# Patient Record
Sex: Male | Born: 2000
Health system: Southern US, Community
[De-identification: ages and names within clinical notes are randomized; demographics above are authoritative.]

## PROBLEM LIST (undated history)

## (undated) DIAGNOSIS — F32A Depression, unspecified: Secondary | ICD-10-CM

## (undated) DIAGNOSIS — R000 Tachycardia, unspecified: Secondary | ICD-10-CM

## (undated) DIAGNOSIS — F419 Anxiety disorder, unspecified: Secondary | ICD-10-CM

## (undated) DIAGNOSIS — T7840XA Allergy, unspecified, initial encounter: Secondary | ICD-10-CM

## (undated) DIAGNOSIS — F900 Attention-deficit hyperactivity disorder, predominantly inattentive type: Secondary | ICD-10-CM

## (undated) HISTORY — DX: Allergy, unspecified, initial encounter: T78.40XA

## (undated) HISTORY — DX: Tachycardia, unspecified: R00.0

## (undated) HISTORY — DX: Anxiety disorder, unspecified: F41.9

## (undated) HISTORY — PX: TONSILLECTOMY: SUR1361

## (undated) HISTORY — PX: NO PAST SURGERIES: SHX2092

## (undated) HISTORY — DX: Depression, unspecified: F32.A

---
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
## 2018-03-10 ENCOUNTER — Encounter: Payer: Self-pay | Admitting: Gynecology

## 2018-03-10 ENCOUNTER — Other Ambulatory Visit: Payer: Self-pay

## 2018-03-10 ENCOUNTER — Ambulatory Visit
Admission: EM | Admit: 2018-03-10 | Discharge: 2018-03-10 | Disposition: A | Discharge status: Home / Self Care | Payer: BC Managed Care – PPO | Attending: Emergency Medicine | Admitting: Emergency Medicine

## 2018-03-10 DIAGNOSIS — R6889 Other general symptoms and signs: Secondary | ICD-10-CM | POA: Diagnosis not present

## 2018-03-10 LAB — RAPID INFLUENZA A&B ANTIGENS
Influenza A (ARMC): NEGATIVE
Influenza B (ARMC): NEGATIVE

## 2018-03-10 LAB — RAPID STREP SCREEN (MED CTR MEBANE ONLY): Streptococcus, Group A Screen (Direct): NEGATIVE

## 2018-03-10 MED ORDER — BENZONATATE 200 MG PO CAPS: ORAL_CAPSULE | ORAL | 0 refills | Status: DC

## 2018-03-10 MED ORDER — HYDROCOD POLST-CPM POLST ER 10-8 MG/5ML PO SUER: 5.0000 mL | Freq: Two times a day (BID) | ORAL | 0 refills | Status: DC

## 2018-03-10 MED ORDER — OSELTAMIVIR PHOSPHATE 75 MG PO CAPS: 75.0000 mg | ORAL_CAPSULE | Freq: Two times a day (BID) | ORAL | 0 refills | Status: DC

## 2018-03-10 NOTE — ED Provider Notes (Signed)
[facility]    CSN: [id number] Arrival date & time: 03/10/18  1254     History   Chief Complaint No chief complaint on file.   HPI Damon Avila is a 17 y.o. male.   HPI  -year-old male presents with a coughnasal congestion and discharge, mildly bloody sputum and a sore throat.  He works at [employer] and received a flu shot in September.  He has been taking care of a sick sibling at home who was diagnosed with flu type a.  His symptoms started suddenly yesterday.        Past Medical History:  Diagnosis Date  . ADHD (attention deficit hyperactivity disorder), inattentive type   . Anxiety    a. Anxiety episode freshman year in high school.  . Sinus tachycardia    a. 04/2017 Echo [facility]): nl LV/RV fxn, structurally nl heart; b. 05/2017 Event monitor (2 wks): Avg rate 82 bpm, Min HR 47, max HR 162. Predominantly sinus rhythm. Isolated SVE's/couplets (rare - <1%).    Patient Active Problem List   Diagnosis Date Noted  . Tachycardia 05/15/2017  . Attention deficit hyperactivity disorder (ADHD), combined type 06/26/2013  . GAD (generalized anxiety disorder) 06/26/2013  . MDD (major depressive disorder), single episode, severe (HCC) 06/25/2013    Past Surgical History:  Procedure Laterality Date  . NO PAST SURGERIES         Home Medications    Prior to Admission medications   Medication Sig Start Date End Date Taking? Authorizing Provider  metoprolol succinate (TOPROL-XL) 25 MG 24 hr tablet Take 1 tablet (25 mg total) by mouth daily. 08/04/17  Yes Creig HinesBerge, Christopher Ronald, NP  propranolol (INDERAL) 10 MG tablet Take 1 tablet (10 mg total) by mouth 3 (three) times daily as needed. 05/17/17  Yes Antonieta IbaGollan, Timothy J, MD  benzonatate (TESSALON) 200 MG capsule Take one cap TID PRN cough 03/10/18   Lutricia Feiloemer, Aneeka Bowden P, PA-C  chlorpheniramine-HYDROcodone [facility](TUSSIONEX PENNKINETIC ER) 10-8 MG/5ML SUER Take 5 mLs by mouth 2 (two) times daily. 03/10/18   Lutricia Feiloemer, Alize Borrayo P, PA-C    ondansetron (ZOFRAN ODT) 4 MG disintegrating tablet Take 1 tablet (4 mg total) by mouth every 8 (eight) hours as needed for nausea or vomiting. 08/30/17   Renne CriglerGeiple, Joshua, PA-C  oseltamivir (TAMIFLU) 75 MG capsule Take 1 capsule (75 mg total) by mouth every 12 (twelve) hours. 03/10/18   Lutricia Feiloemer, Maddisen Vought P, PA-C    Family History Family History  Problem Relation Age of Onset  . Depression Mother   . Depression Father     Social History Social History   Tobacco Use  . Smoking status: Never Smoker  . Smokeless tobacco: Never Used  Substance Use Topics  . Alcohol use: No    Alcohol/week: 0.0 standard drinks  . Drug use: No     Allergies   Patient has no known allergies.   Review of Systems Review of Systems  Constitutional: Positive for activity change, chills, fatigue and fever.  HENT: Positive for congestion, postnasal drip, rhinorrhea, sinus pressure, sinus pain and sore throat.   Respiratory: Positive for cough.   All other systems reviewed and are negative.    Physical Exam Triage Vital Signs ED Triage Vitals  Enc Vitals Group     BP 03/10/18 1321 107/69     Pulse Rate 03/10/18 1321 (!) 108     Resp 03/10/18 1321 16     Temp 03/10/18 1321 99 F (37.2 C)     Temp  Source 03/10/18 1318 Oral     SpO2 03/10/18 1321 99 %     Weight 03/10/18 1323 135 lb (61.2 kg)     Height 03/10/18 1323 5\' 8"  (1.727 m)     Head Circumference --      Peak Flow --      Pain Score 03/10/18 1323 3     Pain Loc --      Pain Edu? --      Excl. in GC? --    No data found.  Updated Vital Signs BP 107/69 (BP Location: Left Arm)   Pulse (!) 108   Temp 99 F (37.2 C)   Resp 16   Ht 5\' 8"  (1.727 m)   Wt 135 lb (61.2 kg)   SpO2 99%   BMI 20.53 kg/m   Visual Acuity Right Eye Distance:   Left Eye Distance:   Bilateral Distance:    Right Eye Near:   Left Eye Near:    Bilateral Near:     Physical Exam Vitals signs and nursing note reviewed.  Constitutional:      General: He  is not in acute distress.    Appearance: Normal appearance. He is normal weight. He is not ill-appearing, toxic-appearing or diaphoretic.  HENT:     Head: Normocephalic and atraumatic.     Right Ear: Tympanic membrane and ear canal normal.     Left Ear: Tympanic membrane and ear canal normal.     Nose: Nose normal.     Mouth/Throat:     Mouth: Mucous membranes are moist.     Pharynx: No oropharyngeal exudate or posterior oropharyngeal erythema.  Eyes:     General:        Right eye: No discharge.        Left eye: No discharge.     Conjunctiva/sclera: Conjunctivae normal.     Pupils: Pupils are equal, round, and reactive to light.  Neck:     Musculoskeletal: Normal range of motion and neck supple.  Pulmonary:     Effort: Pulmonary effort is normal.     Breath sounds: Normal breath sounds.  Musculoskeletal: Normal range of motion.  Lymphadenopathy:     Cervical: No cervical adenopathy.  Skin:    General: Skin is warm and dry.  Neurological:     General: No focal deficit present.     Mental Status: He is alert and oriented to person, place, and time.  Psychiatric:        Mood and Affect: Mood normal.        Behavior: Behavior normal.        Thought Content: Thought content normal.        Judgment: Judgment normal.      UC Treatments / Results  Labs (all labs ordered are listed, but only abnormal results are displayed) Labs Reviewed  RAPID STREP SCREEN ([facility] ONLY)  RAPID INFLUENZA A&B ANTIGENS ([facility] ONLY)  CULTURE, GROUP A STREP [facility])    EKG None  Radiology No results found.  Procedures Procedures (including critical care time)  Medications Ordered in UC Medications - No data to display  Initial Impression / Assessment and Plan / UC Course  I have reviewed the triage vital signs and the nursing notes.  Pertinent labs & imaging results that were available during my care of the patient were reviewed by me and considered in my medical decision making  (see chart for details).   Patient has symptoms of flulike illness.  He already had a flu shot but despite that after caring for a very close sibling with type A positive flu he now has the same symptoms.  Offered him Tamiflu which he excepted.  I will also treat his cough with cough suppressants.  Remain out of work until his fever is broken.  He must rest and drink plenty of fluids.  Use Tylenol or Motrin for body aches or fever.  He worsens he should return to our clinic or follow-up at a primary care physician   Final Clinical Impressions(s) / UC Diagnoses   Final diagnoses:  Flu-like symptoms   Discharge Instructions   None    ED Prescriptions    Medication Sig Dispense Auth. Provider   oseltamivir (TAMIFLU) 75 MG capsule Take 1 capsule (75 mg total) by mouth every 12 (twelve) hours. 10 capsule Lutricia Feil, PA-C   chlorpheniramine-HYDROcodone (TUSSIONEX PENNKINETIC ER) 10-8 MG/5ML SUER Take 5 mLs by mouth 2 (two) times daily. 60 mL Ovid Curd P, PA-C   benzonatate (TESSALON) 200 MG capsule Take one cap TID PRN cough 30 capsule Lutricia Feil, PA-C     Controlled Substance Prescriptions [place] Controlled Substance Registry consulted? Not Applicable   Lutricia Feil, PA-C 03/10/18 1423

## 2018-03-10 NOTE — ED Triage Notes (Signed)
Patient c/o cough / nasal drip/ bloody sputum / sore throat. Per patient member in household diagnose with flu.

## 2018-03-13 ENCOUNTER — Telehealth (HOSPITAL_COMMUNITY): Payer: Self-pay | Admitting: Emergency Medicine

## 2018-03-13 LAB — CULTURE, GROUP A STREP (THRC)

## 2018-03-13 NOTE — Telephone Encounter (Signed)
Culture is positive for non group A Strep germ.  This is a finding of uncertain significance; not the typical 'strep throat' germ.  Pt states hes not worried about his throat and went to his PCP and they placed him on antibiotics for pneumonia. All questions answered.

## 2018-12-11 DIAGNOSIS — J0301 Acute recurrent streptococcal tonsillitis: Secondary | ICD-10-CM | POA: Insufficient documentation

## 2018-12-11 HISTORY — DX: Acute recurrent streptococcal tonsillitis: J03.01

## 2018-12-16 ENCOUNTER — Encounter: Payer: Self-pay | Admitting: Emergency Medicine

## 2018-12-16 ENCOUNTER — Other Ambulatory Visit: Payer: Self-pay

## 2018-12-16 ENCOUNTER — Ambulatory Visit
Admission: EM | Admit: 2018-12-16 | Discharge: 2018-12-16 | Disposition: A | Discharge status: Home / Self Care | Payer: BC Managed Care – PPO | Attending: Family Medicine | Admitting: Family Medicine

## 2018-12-16 DIAGNOSIS — J039 Acute tonsillitis, unspecified: Secondary | ICD-10-CM

## 2018-12-16 DIAGNOSIS — J029 Acute pharyngitis, unspecified: Secondary | ICD-10-CM

## 2018-12-16 LAB — RAPID STREP SCREEN (MED CTR MEBANE ONLY): Streptococcus, Group A Screen (Direct): NEGATIVE

## 2018-12-16 MED ORDER — CEFDINIR 300 MG PO CAPS: 300.0000 mg | ORAL_CAPSULE | Freq: Two times a day (BID) | ORAL | 0 refills | Status: AC

## 2018-12-16 NOTE — ED Triage Notes (Signed)
Patient c/o sore throat for the past 2-3 days.  Patient denies fevers.  

## 2018-12-16 NOTE — ED Provider Notes (Signed)
[facility]    CSN: [id number] Arrival date & time: 12/16/18  [id number]  History   Chief Complaint Chief Complaint  Patient presents with  . Sore Throat   HPI  18 year old male presents with sore throat.  2 to 3-day history of sore throat.  Pain is currently mild, 5/10 in severity.  Patient states that he has a history of recurrent strep pharyngitis/tonsillitis.  Patient states that he had strep approximately 1 month ago.  Patient states that he has an upcoming surgery scheduled at the end of the year for tonsillectomy.  Patient denies fever.  Denies cough or shortness of breath.  He has no other associated symptoms.  No medications or interventions tried.  No known exacerbating or relieving factors.  No other complaints.  PMH, Surgical Hx, Family Hx, Social History reviewed and updated as below.  Past Medical History:  Diagnosis Date  . ADHD (attention deficit hyperactivity disorder), inattentive type   . Anxiety    a. Anxiety episode freshman year in high school.  . Sinus tachycardia    a. 04/2017 Echo [facility]): nl LV/RV fxn, structurally nl heart; b. 05/2017 Event monitor (2 wks): Avg rate 82 bpm, Min HR 47, max HR 162. Predominantly sinus rhythm. Isolated SVE's/couplets (rare - <1%).  Recurrent strep  Patient Active Problem List   Diagnosis Date Noted  . Tachycardia 05/15/2017  . Attention deficit hyperactivity disorder (ADHD), combined type 06/26/2013  . GAD (generalized anxiety disorder) 06/26/2013  . MDD (major depressive disorder), single episode, severe (HCC) 06/25/2013    Past Surgical History:  Procedure Laterality Date  . NO PAST SURGERIES      Home Medications    Prior to Admission medications   Medication Sig Start Date End Date Taking? Authorizing Provider  cetirizine (ZYRTEC) 10 MG tablet Take by mouth.   Yes [provider]  propranolol (INDERAL) 10 MG tablet Take 1 tablet (10 mg total) by mouth 3 (three) times daily as needed. 05/17/17  Yes  Antonieta IbaGollan, Timothy J, MD  sertraline (ZOLOFT) 50 MG tablet  08/27/18  Yes [provider]  traZODone (DESYREL) 50 MG tablet Take by mouth.   Yes [provider]  cefdinir (OMNICEF) 300 MG capsule Take 1 capsule (300 mg total) by mouth 2 (two) times daily for 10 days. 12/16/18 12/26/18  Tommie Samsook, Christella App G, DO  metoprolol succinate (TOPROL-XL) 25 MG 24 hr tablet Take 1 tablet (25 mg total) by mouth daily. 08/04/17 12/16/18  Creig HinesBerge, Christopher Ronald, NP    Family History Family History  Problem Relation Age of Onset  . Depression Mother   . Depression Father     Social History Social History   Tobacco Use  . Smoking status: Never Smoker  . Smokeless tobacco: Never Used  Substance Use Topics  . Alcohol use: No    Alcohol/week: 0.0 standard drinks  . Drug use: No     Allergies   Patient has no known allergies.   Review of Systems Review of Systems  Constitutional: Negative for fever.  HENT: Positive for sore throat.   Respiratory: Negative.    Physical Exam Triage Vital Signs ED Triage Vitals  Enc Vitals Group     BP 12/16/18 0832 115/69     Pulse Rate 12/16/18 0832 81     Resp 12/16/18 0832 16     Temp 12/16/18 0832 98.4 F (36.9 C)     Temp Source 12/16/18 0832 Oral     SpO2 12/16/18 0832 100 %  Weight 12/16/18 0827 132 lb (59.9 kg)     Height 12/16/18 0827 5\' 7"  (1.702 m)     Head Circumference --      Peak Flow --      Pain Score 12/16/18 0827 5     Pain Loc --      Pain Edu? --      Excl. in [place]? --    Updated Vital Signs BP 115/69 (BP Location: Left Arm)   Pulse 81   Temp 98.4 F (36.9 C) (Oral)   Resp 16   Ht 5\' 7"  (1.702 m)   Wt 59.9 kg   SpO2 100%   BMI 20.67 kg/m   Visual Acuity Right Eye Distance:   Left Eye Distance:   Bilateral Distance:    Right Eye Near:   Left Eye Near:    Bilateral Near:     Physical Exam Vitals signs and nursing note reviewed.  Constitutional:      Appearance: He is well-developed. He is not  ill-appearing.  HENT:     Head: Normocephalic and atraumatic.     Right Ear: Tympanic membrane and ear canal normal.     Left Ear: Tympanic membrane and ear canal normal.     Nose: No rhinorrhea.     Mouth/Throat:     Mouth: Mucous membranes are moist.     Pharynx: Uvula midline. Posterior oropharyngeal erythema present. No oropharyngeal exudate or uvula swelling.     Tonsils: No tonsillar exudate. 2+ on the right. 2+ on the left.  Eyes:     General:        Right eye: No discharge.        Left eye: No discharge.     Conjunctiva/sclera: Conjunctivae normal.  Cardiovascular:     Rate and Rhythm: Normal rate and regular rhythm.     Heart sounds: No murmur.  Pulmonary:     Effort: Pulmonary effort is normal.     Breath sounds: Normal breath sounds. No wheezing, rhonchi or rales.  Skin:    General: Skin is warm.     Findings: No rash.  Neurological:     Mental Status: He is alert and oriented to person, place, and time.  Psychiatric:        Mood and Affect: Mood normal.        Behavior: Behavior normal.    UC Treatments / Results  Labs (all labs ordered are listed, but only abnormal results are displayed) Labs Reviewed  RAPID STREP SCREEN ([facility] ONLY)  CULTURE, GROUP A STREP [facility])    EKG   Radiology No results found.  Procedures Procedures (including critical care time)  Medications Ordered in UC Medications - No data to display  Initial Impression / Assessment and Plan / UC Course  I have reviewed the triage vital signs and the nursing notes.  Pertinent labs & imaging results that were available during my care of the patient were reviewed by me and considered in my medical decision making (see chart for details).    18 year old male presents with tonsillitis.  Rapid strep negative.  Suspect that patient is going to test positive on culture.  Placing on empiric Omnicef while awaiting culture.  Final Clinical Impressions(s) / UC Diagnoses   Final  diagnoses:  Acute tonsillitis, unspecified etiology     Discharge Instructions     Strep negative.  Antibiotic while awaiting culture.  Take care  Dr. Lacinda Axon    ED Prescriptions  Medication Sig Dispense Auth. Provider   cefdinir (OMNICEF) 300 MG capsule Take 1 capsule (300 mg total) by mouth 2 (two) times daily for 10 days. 20 capsule Tommie Sams, DO     PDMP not reviewed this encounter.   Tommie Sams, [place] 12/16/18 [phone]

## 2018-12-16 NOTE — Discharge Instructions (Signed)
Strep negative.  Antibiotic while awaiting culture.  Take care  Dr. Lacinda Axon

## 2018-12-17 ENCOUNTER — Telehealth (HOSPITAL_COMMUNITY): Payer: Self-pay | Admitting: Emergency Medicine

## 2018-12-17 LAB — CULTURE, GROUP A STREP (THRC)

## 2018-12-17 NOTE — Telephone Encounter (Signed)
Strep culture positive. Pt on cefdinir. Contacted pt to let him know. All questions answered.

## 2018-12-23 ENCOUNTER — Ambulatory Visit
Admission: EM | Admit: 2018-12-23 | Discharge: 2018-12-23 | Disposition: A | Discharge status: Home / Self Care | Payer: BC Managed Care – PPO | Attending: Family Medicine | Admitting: Family Medicine

## 2018-12-23 ENCOUNTER — Other Ambulatory Visit: Payer: Self-pay

## 2018-12-23 ENCOUNTER — Encounter: Payer: Self-pay | Admitting: Emergency Medicine

## 2018-12-23 DIAGNOSIS — R197 Diarrhea, unspecified: Secondary | ICD-10-CM | POA: Diagnosis not present

## 2018-12-23 LAB — C DIFFICILE QUICK SCREEN W PCR REFLEX
C Diff antigen: NEGATIVE
C Diff interpretation: NOT DETECTED
C Diff toxin: NEGATIVE

## 2018-12-23 NOTE — Discharge Instructions (Signed)
Continue medication as prescribed. Rest. Drink plenty of fluids. Over the counter probiotics. Monitor.   Follow up with your primary care physician this week as needed. Return to Urgent care for new or worsening concerns.

## 2018-12-23 NOTE — ED Triage Notes (Signed)
Patient c/o diarrhea that started this morning.  Patient states that he has been taking Omnicef for strep throat.  Patient denies fevers.  Patient also reports stomach cramps.

## 2018-12-23 NOTE — ED Provider Notes (Signed)
[facility] ____________________________________________  Time seen: Approximately 11:26 AM  I have reviewed the triage vital signs and the nursing notes.   HISTORY  Chief Complaint Diarrhea   HPI Damon Avila is a 18 y.o. male presenting for evaluation of diarrhea that started this morning.  Reports he has had multiple at least 5 episodes of diarrhea since starting.  States some intermittent lower abdominal cramping but not specific pain.  Denies accompanying fevers, vomiting, back pain, dysuria or other complaints.  Patient was seen 7 days ago and treated for strep throat with oral Omnicef.  Patient expressed concern of C. difficile because of diarrhea onset while taking antibiotic.  Denies known food triggers.  Diarrhea is described as loose, brown, somewhat mucousy.  Nonbloody diarrhea.  No fevers.  Reports sore throat much improved.  Reports otherwise doing well.  Denies aggravating or alleviating factors.  Reports this is approximately his third course of antibiotics this year.    Past Medical History:  Diagnosis Date  . ADHD (attention deficit hyperactivity disorder), inattentive type   . Anxiety    a. Anxiety episode freshman year in high school.  . Sinus tachycardia    a. 04/2017 Echo [facility]): nl LV/RV fxn, structurally nl heart; b. 05/2017 Event monitor (2 wks): Avg rate 82 bpm, Min HR 47, max HR 162. Predominantly sinus rhythm. Isolated SVE's/couplets (rare - <1%).    Patient Active Problem List   Diagnosis Date Noted  . Tachycardia 05/15/2017  . Attention deficit hyperactivity disorder (ADHD), combined type 06/26/2013  . GAD (generalized anxiety disorder) 06/26/2013  . MDD (major depressive disorder), single episode, severe ([place]) 06/25/2013    Past Surgical History:  Procedure Laterality Date  . NO PAST SURGERIES       No current facility-administered medications for this encounter.   Current Outpatient Medications:  .  cefdinir (OMNICEF) 300 MG  capsule, Take 1 capsule (300 mg total) by mouth 2 (two) times daily for 10 days., Disp: 20 capsule, Rfl: 0 .  cetirizine (ZYRTEC) 10 MG tablet, Take by mouth., Disp: , Rfl:  .  propranolol (INDERAL) 10 MG tablet, Take 1 tablet (10 mg total) by mouth 3 (three) times daily as needed., Disp: 90 tablet, Rfl: 3 .  sertraline (ZOLOFT) 50 MG tablet, , Disp: , Rfl:  .  traZODone (DESYREL) 50 MG tablet, Take by mouth., Disp: , Rfl:   Allergies Patient has no known allergies.  Family History  Problem Relation Age of Onset  . Depression Mother   . Depression Father     Social History Social History   Tobacco Use  . Smoking status: Never Smoker  . Smokeless tobacco: Never Used  Substance Use Topics  . Alcohol use: No    Alcohol/week: 0.0 standard drinks  . Drug use: No    Review of Systems Constitutional: No fever ENT: Recent sore throat. Cardiovascular: Denies chest pain. Respiratory: Denies shortness of breath. Gastrointestinal: No abdominal pain.  No nausea, no vomiting.  Positive diarrhea.  No constipation. Genitourinary: Negative for dysuria. Musculoskeletal: Negative for back pain. Skin: Negative for rash.   ____________________________________________   PHYSICAL EXAM:  VITAL SIGNS: ED Triage Vitals  Enc Vitals Group     BP 12/23/18 1100 129/85     Pulse Rate 12/23/18 1100 88     Resp 12/23/18 1100 16     Temp 12/23/18 1100 98.4 F (36.9 C)     Temp Source 12/23/18 1100 Oral     SpO2 12/23/18 1100 99 %  Weight 12/23/18 1057 132 lb (59.9 kg)     Height 12/23/18 1057 5\' 8"  (1.727 m)     Head Circumference --      Peak Flow --      Pain Score 12/23/18 1056 4     Pain Loc --      Pain Edu? --      Excl. in GC? --     Constitutional: Alert and oriented. Well appearing and in no acute distress. Eyes: Conjunctivae are normal.  ENT      Head: Normocephalic and atraumatic. Cardiovascular: Normal rate, regular rhythm. Grossly normal heart sounds.  Good peripheral  circulation. Respiratory: Normal respiratory effort without tachypnea nor retractions. Breath sounds are clear and equal bilaterally. No wheezes, rales, rhonchi. Gastrointestinal: Normal Bowel sounds.  Abdomen soft and minimal diffuse lower abdominal tenderness palpation.  Abdomen otherwise soft nontender.  No CVA tenderness. Musculoskeletal: Steady gait. Neurologic:  Normal speech and language.  Speech is normal. No gait instability.  Skin:  Skin is warm, dry and intact. No rash noted. Psychiatric: Mood and affect are normal. Speech and behavior are normal. Patient exhibits appropriate insight and judgment   ___________________________________________   LABS (all labs ordered are listed, but only abnormal results are displayed)  Labs Reviewed  C DIFFICILE QUICK SCREEN W PCR REFLEX  GI PATHOGEN PANEL BY PCR, STOOL     PROCEDURES Procedures    INITIAL IMPRESSION / ASSESSMENT AND PLAN / ED COURSE  Pertinent labs & imaging results that were available during my care of the patient were reviewed by me and considered in my medical decision making (see chart for details).  Well-appearing patient.  No acute distress.  Currently on cefdinir for strep throat.  Patient has 3 days left, directed to complete antibiotic as prescribed.  Patient started with diarrhea today, nonbloody and no fevers.  Suspect most likely side effect from antibiotic use and recommended over-the-counter probiotics, Imodium starting tomorrow as needed and brat diet.  However will conservatively test GI panel as well as C. difficile.  Supportive care.  Work note given.  Discussed follow up with Primary care physician this week. Discussed follow up and return parameters including no resolution or any worsening concerns. Patient verbalized understanding and agreed to plan.   ____________________________________________   FINAL CLINICAL IMPRESSION(S) / ED DIAGNOSES  Final diagnoses:  Diarrhea, unspecified type      ED Discharge Orders    None       Note: This dictation was prepared with Dragon dictation along with smaller phrase technology. Any transcriptional errors that result from this process are unintentional.         12/25/18, NP 12/23/18 1158

## 2018-12-24 ENCOUNTER — Telehealth (HOSPITAL_COMMUNITY): Payer: Self-pay | Admitting: Emergency Medicine

## 2018-12-24 NOTE — Telephone Encounter (Signed)
C diff negative. Attempted to reach patient. No answer at this time.

## 2018-12-28 ENCOUNTER — Telehealth (HOSPITAL_COMMUNITY): Payer: Self-pay | Admitting: Emergency Medicine

## 2018-12-28 LAB — GI PATHOGEN PANEL BY PCR, STOOL

## 2018-12-28 NOTE — Telephone Encounter (Signed)
No pathogens detected on sample. Attempted to reach patient. No answer at this time. Notified in [place]

## 2019-03-27 ENCOUNTER — Ambulatory Visit: Payer: BC Managed Care – PPO | Attending: Internal Medicine

## 2019-03-27 DIAGNOSIS — Z20822 Contact with and (suspected) exposure to covid-19: Secondary | ICD-10-CM

## 2019-03-28 LAB — NOVEL CORONAVIRUS, NAA: SARS-CoV-2, NAA: NOT DETECTED

## 2019-05-18 ENCOUNTER — Ambulatory Visit: Payer: BC Managed Care – PPO | Attending: Internal Medicine

## 2019-05-18 DIAGNOSIS — Z23 Encounter for immunization: Secondary | ICD-10-CM | POA: Insufficient documentation

## 2019-05-18 NOTE — Progress Notes (Signed)
   Covid-19 Vaccination Clinic  Name:  Damon Avila    MRN: [id number] DOB: 07/25/2000  05/18/2019  Damon Avila was observed post Covid-19 immunization for 15 minutes without incidence. He was provided with Vaccine Information Sheet and instruction to access the V-Safe system.   Damon Avila was instructed to call 911 with any severe reactions post vaccine: Marland Kitchen Difficulty breathing  . Swelling of your face and throat  . A fast heartbeat  . A bad rash all over your body  . Dizziness and weakness    Immunizations Administered    Name Date Dose VIS Date Route   Pfizer COVID-19 Vaccine 05/18/2019  8:35 AM 0.3 mL 03/08/2019 Intramuscular   Manufacturer: [employer], [employer]   Lot: [id number]   NDC: [address]-0

## 2019-06-11 ENCOUNTER — Ambulatory Visit: Payer: BC Managed Care – PPO | Attending: Internal Medicine

## 2019-06-11 DIAGNOSIS — Z23 Encounter for immunization: Secondary | ICD-10-CM

## 2019-06-11 NOTE — Progress Notes (Signed)
   Covid-19 Vaccination Clinic  Name:  Damon Avila    MRN: [id number] DOB: 03-Mar-2001  06/11/2019  Damon Avila was observed post Covid-19 immunization for 15 minutes without incident. He was provided with Vaccine Information Sheet and instruction to access the V-Safe system.   Damon Avila was instructed to call 911 with any severe reactions post vaccine: Marland Kitchen Difficulty breathing  . Swelling of face and throat  . A fast heartbeat  . A bad rash all over body  . Dizziness and weakness   Immunizations Administered    Name Date Dose VIS Date Route   Pfizer COVID-19 Vaccine 06/11/2019  1:10 PM 0.3 mL 03/08/2019 Intramuscular   Manufacturer: [employer], [employer]   Lot: [id number]   NDC: [address]-1
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
## 2019-11-27 ENCOUNTER — Ambulatory Visit
Admission: EM | Admit: 2019-11-27 | Discharge: 2019-11-27 | Disposition: A | Discharge status: Home / Self Care | Payer: 59 | Attending: Family Medicine | Admitting: Family Medicine

## 2019-11-27 DIAGNOSIS — Z202 Contact with and (suspected) exposure to infections with a predominantly sexual mode of transmission: Secondary | ICD-10-CM | POA: Diagnosis not present

## 2019-11-27 DIAGNOSIS — Z113 Encounter for screening for infections with a predominantly sexual mode of transmission: Secondary | ICD-10-CM

## 2019-11-27 MED ORDER — METRONIDAZOLE 500 MG PO TABS: 500.0000 mg | ORAL_TABLET | Freq: Two times a day (BID) | ORAL | 0 refills | Status: DC

## 2019-11-27 MED ORDER — CEFTRIAXONE SODIUM 500 MG IJ SOLR
500.0000 mg | Freq: Once | INTRAMUSCULAR | Status: AC
  Administered 2019-11-27: 500 mg via INTRAMUSCULAR

## 2019-11-27 MED ORDER — AZITHROMYCIN 500 MG PO TABS
1000.0000 mg | ORAL_TABLET | Freq: Once | ORAL | Status: AC
  Administered 2019-11-27: 1000 mg via ORAL

## 2019-11-27 NOTE — ED Provider Notes (Signed)
[facility]   [id number] 11/27/19 Arrival Time: 1422   CC: CONCERN FOR STD  SUBJECTIVE:  Damon Avila is a 19 y.o. male who presents requesting STI screening. Currently asymptomatic. Reports that partner rested positive for chlamydia. Last unprotected sexual encounter last week.  Denies similar symptoms in the past. Requesting UA in office today as well. Denies urinary symptoms.  Denies fever, chills, nausea, vomiting, abdominal or pelvic pain, penile rashes or lesions, testicular swelling or pain.     ROS: As per HPI.  All other pertinent ROS negative.     Past Medical History:  Diagnosis Date  . ADHD (attention deficit hyperactivity disorder), inattentive type   . Anxiety    a. Anxiety episode freshman year in high school.  . Sinus tachycardia    a. 04/2017 Echo [facility]): nl LV/RV fxn, structurally nl heart; b. 05/2017 Event monitor (2 wks): Avg rate 82 bpm, Min HR 47, max HR 162. Predominantly sinus rhythm. Isolated SVE's/couplets (rare - <1%).   Past Surgical History:  Procedure Laterality Date  . NO PAST SURGERIES     No Known Allergies No current facility-administered medications on file prior to encounter.   Current Outpatient Medications on File Prior to Encounter  Medication Sig Dispense Refill  . cetirizine (ZYRTEC) 10 MG tablet Take by mouth.    . propranolol (INDERAL) 10 MG tablet Take 1 tablet (10 mg total) by mouth 3 (three) times daily as needed. 90 tablet 3  . sertraline (ZOLOFT) 50 MG tablet     . traZODone (DESYREL) 50 MG tablet Take by mouth.    . [DISCONTINUED] metoprolol succinate (TOPROL-XL) 25 MG 24 hr tablet Take 1 tablet (25 mg total) by mouth daily. 30 tablet 11   Social History   Socioeconomic History  . Marital status: Single    Spouse name: Not on file  . Number of children: Not on file  . Years of education: Not on file  . Highest education level: Not on file  Occupational History  . Not on file  Tobacco Use  . Smoking status:  Never Smoker  . Smokeless tobacco: Never Used  Vaping Use  . Vaping Use: Never used  Substance and Sexual Activity  . Alcohol use: No    Alcohol/week: 0.0 standard drinks  . Drug use: No  . Sexual activity: Not on file  Other Topics Concern  . Not on file  Social History Narrative  . Not on file   Social Determinants of Health   Financial Resource Strain:   . Difficulty of Paying Living Expenses: Not on file  Food Insecurity:   . Worried About Programme researcher, broadcasting/film/video in the Last Year: Not on file  . Ran Out of Food in the Last Year: Not on file  Transportation Needs:   . Lack of Transportation (Medical): Not on file  . Lack of Transportation (Non-Medical): Not on file  Physical Activity:   . Days of Exercise per Week: Not on file  . Minutes of Exercise per Session: Not on file  Stress:   . Feeling of Stress : Not on file  Social Connections:   . Frequency of Communication with Friends and Family: Not on file  . Frequency of Social Gatherings with Friends and Family: Not on file  . Attends Religious Services: Not on file  . Active Member of Clubs or Organizations: Not on file  . Attends Banker Meetings: Not on file  . Marital Status: Not on file  Intimate Partner Violence:   . Fear of Current or Ex-Partner: Not on file  . Emotionally Abused: Not on file  . Physically Abused: Not on file  . Sexually Abused: Not on file   Family History  Problem Relation Age of Onset  . Depression Mother   . Depression Father     OBJECTIVE:  Vitals:   11/27/19 1439  BP: 119/78  Pulse: 80  Resp: 17  Temp: 98.8 F (37.1 C)  TempSrc: Oral  SpO2: 97%     General appearance: alert, NAD, appears stated age Head: NCAT Throat: lips, mucosa, and tongue normal; teeth and gums normal Lungs: CTA bilaterally without adventitious breath sounds Heart: regular rate and rhythm.  Radial pulses 2+ symmetrical bilaterally Back: no CVA tenderness Abdomen: soft, non-tender; bowel  sounds normal; no masses or organomegaly; no guarding or rebound tenderness GU: deferred Skin: warm and dry Psychological:  Alert and cooperative. Normal mood and affect.  LABS:  Results for orders placed or performed in visit on 03/27/19  Novel Coronavirus, NAA ([facility])   Specimen: Nasopharyngeal(NP) swabs in vial transport medium   NASOPHARYNGE  TESTING  Result Value Ref Range   SARS-CoV-2, NAA Not Detected Not Detected    Labs Reviewed  HIV ANTIBODY (ROUTINE TESTING W REFLEX)  RPR  POCT URINALYSIS DIP (MANUAL ENTRY)  CYTOLOGY, (ORAL, ANAL, URETHRAL) ANCILLARY ONLY    ASSESSMENT & PLAN:  1. STD exposure   2. Screen for STD (sexually transmitted disease)     Meds ordered this encounter  Medications  . cefTRIAXone (ROCEPHIN) injection 500 mg  . azithromycin (ZITHROMAX) tablet 1,000 mg  . metroNIDAZOLE (FLAGYL) 500 MG tablet    Sig: Take 1 tablet (500 mg total) by mouth 2 (two) times daily.    Dispense:  14 tablet    Refill:  0    Order Specific Question:   Supervising Provider    Answer:   Merrilee Jansky [[id number]]    Pending: Labs Reviewed  HIV ANTIBODY (ROUTINE TESTING W REFLEX)  RPR  POCT URINALYSIS DIP (MANUAL ENTRY)  CYTOLOGY, (ORAL, ANAL, URETHRAL) ANCILLARY ONLY   UA negative Given rocephin 250mg  injection and azithromycin 1g in office Penile self swab obtained Prescribed metronidazole 500 mg twice daily for 7 days (do not take while consuming alcohol) Take medications as prescribed and to completion We will follow up with you regarding the results of your test If tests are positive, please abstain from sexual activity until you and your partner(s) are treated Follow up with PCP or [facility] if symptoms persists Return here or go to ER if you have any new or worsening symptoms    Reviewed expectations re: course of current medical issues. Questions answered. Outlined signs and symptoms indicating need for more acute intervention. Patient  verbalized understanding. After Visit Summary given.       , NP 11/27/19 1510

## 2019-11-27 NOTE — Discharge Instructions (Addendum)
You were treated with an antibiotic today called Rocephin as well as azithromycin. Take metronidazole twice a day for 7 days. Do not drink alcohol with this medication.  Do not have sex for 7 days.  Your swab tests are pending.  If your test results are positive, we will call you.  You may need additional treatment and your partner(s) may also need treatment.      Your blood tests should be back by tomorrow. We will be in contact with any abnormal results that require further treatment.

## 2019-11-27 NOTE — ED Triage Notes (Signed)
Pt presents for STD's testing. Denies any symptoms. States one of his sexual partners tested positive for chlamydia

## 2019-11-28 LAB — CYTOLOGY, (ORAL, ANAL, URETHRAL) ANCILLARY ONLY
Chlamydia: NEGATIVE
Comment: NEGATIVE
Comment: NEGATIVE
Comment: NORMAL
Neisseria Gonorrhea: NEGATIVE
Trichomonas: NEGATIVE

## 2019-11-28 LAB — HIV ANTIBODY (ROUTINE TESTING W REFLEX): HIV Screen 4th Generation wRfx: NONREACTIVE

## 2019-11-28 LAB — RPR: RPR Ser Ql: NONREACTIVE

## 2020-05-07 ENCOUNTER — Other Ambulatory Visit: Payer: Self-pay

## 2020-05-07 ENCOUNTER — Encounter: Payer: Self-pay | Admitting: Adult Health

## 2020-05-07 ENCOUNTER — Other Ambulatory Visit (HOSPITAL_COMMUNITY)
Admission: RE | Admit: 2020-05-07 | Discharge: 2020-05-07 | Disposition: A | Discharge status: Home / Self Care | Payer: 59 | Source: Ambulatory Visit | Attending: Adult Health | Admitting: Adult Health

## 2020-05-07 ENCOUNTER — Ambulatory Visit (INDEPENDENT_AMBULATORY_CARE_PROVIDER_SITE_OTHER): Payer: 59 | Admitting: Adult Health

## 2020-05-07 VITALS — BP 117/67 | HR 76 | Temp 98.2°F | Resp 16 | Ht 67.0 in | Wt 142.6 lb

## 2020-05-07 DIAGNOSIS — F411 Generalized anxiety disorder: Secondary | ICD-10-CM

## 2020-05-07 DIAGNOSIS — Z0000 Encounter for general adult medical examination without abnormal findings: Secondary | ICD-10-CM

## 2020-05-07 DIAGNOSIS — Z113 Encounter for screening for infections with a predominantly sexual mode of transmission: Secondary | ICD-10-CM

## 2020-05-07 DIAGNOSIS — Z8616 Personal history of COVID-19: Secondary | ICD-10-CM | POA: Diagnosis not present

## 2020-05-07 DIAGNOSIS — R059 Cough, unspecified: Secondary | ICD-10-CM

## 2020-05-07 DIAGNOSIS — Z79899 Other long term (current) drug therapy: Secondary | ICD-10-CM

## 2020-05-07 DIAGNOSIS — R319 Hematuria, unspecified: Secondary | ICD-10-CM | POA: Diagnosis not present

## 2020-05-07 DIAGNOSIS — Z1389 Encounter for screening for other disorder: Secondary | ICD-10-CM | POA: Diagnosis not present

## 2020-05-07 LAB — POCT URINALYSIS DIPSTICK
Bilirubin, UA: NEGATIVE
Glucose, UA: NEGATIVE
Ketones, UA: NEGATIVE
Leukocytes, UA: NEGATIVE
Nitrite, UA: NEGATIVE
Protein, UA: POSITIVE — AB
Spec Grav, UA: >=1.03 — AB (ref 1.010–1.025)
Urobilinogen, UA: 0.2 E.U./dL
pH, UA: 5 (ref 5.0–8.0)

## 2020-05-07 NOTE — Patient Instructions (Signed)
Health Maintenance, Male Adopting a healthy lifestyle and getting preventive care are important in promoting health and wellness. Ask your health care provider about:  The right schedule for you to have regular tests and exams.  Things you can do on your own to prevent diseases and keep yourself healthy. What should I know about diet, weight, and exercise? Eat a healthy diet  Eat a diet that includes plenty of vegetables, fruits, low-fat dairy products, and lean protein.  Do not eat a lot of foods that are high in solid fats, added sugars, or sodium.   Maintain a healthy weight Body mass index (BMI) is a measurement that can be used to identify possible weight problems. It estimates body fat based on height and weight. Your health care provider can help determine your BMI and help you achieve or maintain a healthy weight. Get regular exercise Get regular exercise. This is one of the most important things you can do for your health. Most adults should:  Exercise for at least 150 minutes each week. The exercise should increase your heart rate and make you sweat (moderate-intensity exercise).  Do strengthening exercises at least twice a week. This is in addition to the moderate-intensity exercise.  Spend less time sitting. Even light physical activity can be beneficial. Watch cholesterol and blood lipids Have your blood tested for lipids and cholesterol at 22 years of age, then have this test every 5 years. You may need to have your cholesterol levels checked more often if:  Your lipid or cholesterol levels are high.  You are older than 22 years of age.  You are at high risk for heart disease. What should I know about cancer screening? Many types of cancers can be detected early and may often be prevented. Depending on your health history and family history, you may need to have cancer screening at various ages. This may include screening for:  Colorectal cancer.  Prostate  cancer.  Skin cancer.  Lung cancer. What should I know about heart disease, diabetes, and high blood pressure? Blood pressure and heart disease  High blood pressure causes heart disease and increases the risk of stroke. This is more likely to develop in people who have high blood pressure readings, are of African descent, or are overweight.  Talk with your health care provider about your target blood pressure readings.  Have your blood pressure checked: ? Every 3-5 years if you are 18-39 years of age. ? Every year if you are 40 years old or older.  If you are between the ages of 65 and 75 and are a current or former smoker, ask your health care provider if you should have a one-time screening for abdominal aortic aneurysm (AAA). Diabetes Have regular diabetes screenings. This checks your fasting blood sugar level. Have the screening done:  Once every three years after age 45 if you are at a normal weight and have a low risk for diabetes.  More often and at a younger age if you are overweight or have a high risk for diabetes. What should I know about preventing infection? Hepatitis B If you have a higher risk for hepatitis B, you should be screened for this virus. Talk with your health care provider to find out if you are at risk for hepatitis B infection. Hepatitis C Blood testing is recommended for:  Everyone born from 1945 through 1965.  Anyone with known risk factors for hepatitis C. Sexually transmitted infections (STIs)  You should be screened each   year for STIs, including gonorrhea and chlamydia, if: ? You are sexually active and are younger than 22 years of age. ? You are older than 22 years of age and your health care provider tells you that you are at risk for this type of infection. ? Your sexual activity has changed since you were last screened, and you are at increased risk for chlamydia or gonorrhea. Ask your health care provider if you are at risk.  Ask your  health care provider about whether you are at high risk for HIV. Your health care provider may recommend a prescription medicine to help prevent HIV infection. If you choose to take medicine to prevent HIV, you should first get tested for HIV. You should then be tested every 3 months for as long as you are taking the medicine. Follow these instructions at home: Lifestyle  Do not use any products that contain nicotine or tobacco, such as cigarettes, e-cigarettes, and chewing tobacco. If you need help quitting, ask your health care provider.  Do not use street drugs.  Do not share needles.  Ask your health care provider for help if you need support or information about quitting drugs. Alcohol use  Do not drink alcohol if your health care provider tells you not to drink.  If you drink alcohol: ? Limit how much you have to 0-2 drinks a day. ? Be aware of how much alcohol is in your drink. In the [place] one drink equals one 12 oz bottle of beer (355 mL), one 5 oz glass of wine (148 mL), or one 1 oz glass of hard liquor (44 mL). General instructions  Schedule regular health, dental, and eye exams.  Stay current with your vaccines.  Tell your health care provider if: ? You often feel depressed. ? You have ever been abused or do not feel safe at home. Summary  Adopting a healthy lifestyle and getting preventive care are important in promoting health and wellness.  Follow your health care provider's instructions about healthy diet, exercising, and getting tested or screened for diseases.  Follow your health care provider's instructions on monitoring your cholesterol and blood pressure. This information is not intended to replace advice given to you by your health care provider. Make sure you discuss any questions you have with your health care provider. Document Revised: 03/07/2018 Document Reviewed: 03/07/2018 Elsevier Patient Education  2021 Elsevier Inc. Preventing Sexually  Transmitted Infections, Adult Sexually transmitted infections (STIs) are diseases that are spread from person to person (are contagious). They are spread, or transmitted, through bodily fluids exchanged during sex or sexual contact. These bodily fluids include saliva, semen, blood, vaginal mucus, and urine. STIs are very common among people of all ages. Some common STIs include:  Herpes.  Hepatitis B.  Chlamydia.  Gonorrhea.  Syphilis.  HPV (human papillomavirus).  HIV, also called the human immunodeficiency virus. This is the virus that can cause AIDS (acquired immunodeficiency syndrome). Often, people who have these STIs do not have symptoms. Even without symptoms, these infections can be spread from person to person and require treatment. How can these conditions affect me? STIs can be treated, and many STIs can be cured. However, some STIs cannot be cured and will affect you for the rest of your life. Certain STIs may:  Require you to take medicine for the rest of your life.  Affect your ability to have children (your fertility).  Increase your risk for developing another STI or certain serious health conditions. These may  include: ? Cervical cancer. ? Head and neck cancer. ? Pelvic inflammatory disease (PID), in women. ? Organ damage or damage to other parts of your body, if the infection spreads.  Cause problems during pregnancy and may be transmitted to the baby during the pregnancy or childbirth. What can increase my risk? You may have an increased risk for developing an STI if:  You have unprotected sex. Sex includes oral, vaginal, or anal sex.  You have more than one sex partner.  You have a sex partner who has multiple sex partners.  You have sex with someone who has an STI.  You have an STI or you had an STI before.  You inject drugs or have a sex partner or partners who inject drugs. What actions can I take to prevent STIs? The only way to completely  prevent STIs is not to have sex of any kind. This is called practicing abstinence. If you are sexually active, you can protect yourself and others by taking these actions to lower your risk of getting an STI: Lifestyle Avoid mixing alcohol, drugs, and sex. Alcohol and drug use can affect your ability to make good decisions and can lead to risky sexual behaviors. Medicines Ask your health care provider about taking pre-exposure prophylaxis (PrEP) to prevent HIV infection. General information  Stay up to date on vaccinations. Certain vaccines can lower your risk of getting certain STIs, such as: ? Hepatitis A and hepatitis B vaccines. You may have been vaccinated as a young child, but you will likely need a booster shot as a teen or young adult. ? HPV (human papillomavirus) vaccine.  Have only one sex partner (be monogamous) or limit the number of sex partners you have.  Use methods that prevent the exchange of body fluids between partners (barrier protection) correctly every time you have sex. Barrier protection can be used during oral, vaginal, or anal sex. Commonly used barrier methods include: ? Male condom. ? Male condom. ? Dental dam.  Use a new condom for every sex act from start to finish.  Get tested for STIs. Have your partners get tested, too.  If you test positive for an STI, follow recommendations from your health care provider about treatment and make sure your sex partners are tested and treated.  Birth control pills, injections, implants, and intrauterine devices (IUDs) do not protect against STIs. To prevent both STIs and pregnancy, always use a condom with another form of birth control.  Some STIs, such as herpes, are spread through skin-to-skin contact. A condom may not protect you from getting such STIs. Avoid all sexual contact if you or your partners have herpes and there is an active flare with open sores.   Where to find more information Learn more about STIs  from:  Centers for Disease Control and Prevention: ? More information about specific STIs: BloggingList.ca ? Places to get sexual health counseling and treatment for free or at a low cost: gettested.TonerPromos.no  [place] Department of Health and Human Services: http://hoffman.com/ Summary  Sexually transmitted infections (STIs) can spread through exchanging bodily fluids during sexual contact. Fluids include saliva, semen, blood, vaginal mucus, and urine.  You may have an increased risk for developing an STI if you have unprotected sex. Sex includes oral, vaginal, or anal sex.  If you do have sex, limit your number of sex partners and use barrier protection every time you have sex. This information is not intended to replace advice given to you by your health care  provider. Make sure you discuss any questions you have with your health care provider. Document Revised: 04/29/2019 Document Reviewed: 04/29/2019 Elsevier Patient Education  2021 [employer].

## 2020-05-07 NOTE — Progress Notes (Signed)
New patient visit   Patient: Damon Avila   DOB: Apr 05, 2000   19 y.o. Male  MRN: [id number] Visit Date: 05/07/2020  Today's healthcare provider: Jairo Ben, FNP   Chief Complaint  Patient presents with  . New Patient (Initial Visit)   Subjective    Damon Avila is a 20 y.o. male who presents today as a new patient to establish care.  HPI  Patient presents today as new patient to establish care he states that he feels well today and has no concerns to address. Patient reports that he follows a well balanced diet eating up to 3 meals a day, he is actively exercising and states that sleep patterns are fairly well given that he works 3rd shift.    He desires to be std testing, has had 3 new sexual partners in the last 3 months. He denies any symptoms Denies any penile discharge, rectal or oral symptoms, no urinary symptoms.   He has been on Descovy for HIV prophylaxis in June but not since then. He stopped it.     Patient  denies any fever, body aches,chills, rash, chest pain, shortness of breath, nausea, vomiting, or diarrhea.  Denies dizziness, lightheadedness, pre syncopal or syncopal episodes.    Past Medical History:  Diagnosis Date  . ADHD (attention deficit hyperactivity disorder), inattentive type   . Allergy   . Anxiety    a. Anxiety episode freshman year in high school.  . Depression   . Sinus tachycardia    a. 04/2017 Echo [facility]): nl LV/RV fxn, structurally nl heart; b. 05/2017 Event monitor (2 wks): Avg rate 82 bpm, Min HR 47, max HR 162. Predominantly sinus rhythm. Isolated SVE's/couplets (rare - <1%).   Past Surgical History:  Procedure Laterality Date  . TONSILLECTOMY     Family Status  Relation Name Status  . Mother  Alive  . Father  Alive  . MGM  (Not Specified)  . MGF  (Not Specified)  . PGM  (Not Specified)   Family History  Problem Relation Age of Onset  . Depression Mother   . Diabetes Mother        Type 2  . Depression Father    . COPD Maternal Grandmother   . Bipolar disorder Maternal Grandmother   . Cancer Maternal Grandfather   . Cancer Paternal Grandmother    Social History   Socioeconomic History  . Marital status: Single    Spouse name: Not on file  . Number of children: Not on file  . Years of education: Not on file  . Highest education level: Not on file  Occupational History  . Not on file  Tobacco Use  . Smoking status: Never Smoker  . Smokeless tobacco: Never Used  Vaping Use  . Vaping Use: Never used  Substance and Sexual Activity  . Alcohol use: Yes    Alcohol/week: 2.0 - 3.0 standard drinks    Types: 2 - 3 Glasses of wine per week  . Drug use: No  . Sexual activity: Not on file  Other Topics Concern  . Not on file  Social History Narrative  . Not on file   Social Determinants of Health   Financial Resource Strain: Not on file  Food Insecurity: Not on file  Transportation Needs: Not on file  Physical Activity: Not on file  Stress: Not on file  Social Connections: Not on file   Outpatient Medications Prior to Visit  Medication Sig  . hydrOXYzine (  ATARAX/VISTARIL) 10 MG tablet Take 10 mg by mouth 3 (three) times daily as needed.  . [DISCONTINUED] cetirizine (ZYRTEC) 10 MG tablet Take by mouth.  . [DISCONTINUED] metroNIDAZOLE (FLAGYL) 500 MG tablet Take 1 tablet (500 mg total) by mouth 2 (two) times daily.  . [DISCONTINUED] propranolol (INDERAL) 10 MG tablet Take 1 tablet (10 mg total) by mouth 3 (three) times daily as needed.  . [DISCONTINUED] sertraline (ZOLOFT) 50 MG tablet   . [DISCONTINUED] traZODone (DESYREL) 50 MG tablet Take by mouth.   No facility-administered medications prior to visit.   No Known Allergies  Immunization History  Administered Date(s) Administered  . PFIZER(Purple Top)SARS-COV-2 Vaccination 05/18/2019, 06/11/2019    Health Maintenance  Topic Date Due  . Hepatitis C Screening  Never done  . TETANUS/TDAP  Never done  . COVID-19 Vaccine (3 -  Booster for Pfizer series) 12/12/2019  . INFLUENZA VACCINE  Completed  . HIV Screening  Completed    Patient Care Team: Patient, No Pcp Per as PCP - General (General Practice)  Review of Systems  Constitutional: Negative.   HENT: Negative.   Respiratory: Positive for cough (intermittent ) and shortness of breath (occasionial since covid january 9th ). Negative for apnea, choking, chest tightness, wheezing and stridor.   Genitourinary: Negative.   All other systems reviewed and are negative.     Objective    BP 117/67   Pulse 76   Temp 98.2 F (36.8 C) (Oral)   Resp 16   Ht 5\' 7"  (1.702 m)   Wt 142 lb 9.6 oz (64.7 kg)   SpO2 100%   BMI 22.33 kg/m  Physical Exam Vitals and nursing note reviewed.  Constitutional:      General: He is active. He is not in acute distress.    Appearance: Normal appearance. He is well-developed and well-nourished. He is not ill-appearing, toxic-appearing, sickly-appearing or diaphoretic.     Comments: Patient is alert and oriented and responsive to questions Engages in eye contact with provider. Speaks in full sentences without any pauses without any shortness of breath or distress.    HENT:     Head: Normocephalic and atraumatic.     Right Ear: Hearing, tympanic membrane, ear canal and external ear normal.     Left Ear: Hearing, tympanic membrane, ear canal and external ear normal.     Nose: Nose normal.     Mouth/Throat:     Mouth: Oropharynx is clear and moist and mucous membranes are normal.     Pharynx: Uvula midline. No oropharyngeal exudate.  Eyes:     General: Lids are normal. No scleral icterus.       Right eye: No discharge.        Left eye: No discharge.     Extraocular Movements: EOM normal.     Conjunctiva/sclera: Conjunctivae normal.     Pupils: Pupils are equal, round, and reactive to light.  Neck:     Thyroid: No thyromegaly.     Vascular: Normal carotid pulses. No carotid bruit, hepatojugular reflux or JVD.     Trachea:  Trachea and phonation normal. No tracheal tenderness or tracheal deviation.     Meningeal: Brudzinski's sign absent.  Cardiovascular:     Rate and Rhythm: Normal rate and regular rhythm.     Pulses: Normal pulses and intact distal pulses.     Heart sounds: Normal heart sounds, S1 normal and S2 normal. Heart sounds not distant. No murmur heard. No friction rub. No gallop.  Pulmonary:     Effort: Pulmonary effort is normal. No accessory muscle usage or respiratory distress.     Breath sounds: No stridor. No wheezing, rhonchi or rales.  Chest:     Chest wall: No tenderness.  Abdominal:     General: Bowel sounds are normal. There is no distension. Aorta is normal.     Palpations: Abdomen is soft. There is no mass.     Tenderness: There is no abdominal tenderness. There is no right CVA tenderness, left CVA tenderness, guarding or rebound.     Hernia: No hernia is present.  Musculoskeletal:        General: No tenderness, deformity or edema. Normal range of motion.     Cervical back: Full passive range of motion without pain, normal range of motion and neck supple.     Comments: Patient moves on and off of exam table and in room without difficulty. Gait is normal in hall and in room. Patient is oriented to person place time and situation. Patient answers questions appropriately and engages in conversation.   Lymphadenopathy:     Head:     Right side of head: No submental, submandibular, tonsillar, preauricular, posterior auricular or occipital adenopathy.     Left side of head: No submental, submandibular, tonsillar, preauricular, posterior auricular or occipital adenopathy.     Cervical: No cervical adenopathy.     Upper Body:  No axillary adenopathy present. Skin:    General: Skin is warm, dry and intact.     Capillary Refill: Capillary refill takes less than 2 seconds.     Coloration: Skin is not pale.     Findings: No erythema or rash.     Nails: There is no clubbing or cyanosis.   Neurological:     Mental Status: He is alert and oriented to person, place, and time.     GCS: GCS eye subscore is 4. GCS verbal subscore is 5. GCS motor subscore is 6.     Cranial Nerves: No cranial nerve deficit.     Sensory: No sensory deficit.     Motor: No abnormal muscle tone.     Coordination: He displays a negative Romberg sign. Coordination normal.     Gait: Gait normal.     Deep Tendon Reflexes: Strength normal and reflexes are normal and symmetric. Reflexes normal.  Psychiatric:        Mood and Affect: Mood and affect and mood normal.        Speech: Speech normal.        Behavior: Behavior normal.        Thought Content: Thought content normal.        Cognition and Memory: Cognition and memory normal.        Judgment: Judgment normal.      Depression Screen No flowsheet data found. No results found for any visits on 05/07/20.  Assessment & Plan      Routine health maintenance - Plan: CBC with Differential/Platelet, Comprehensive Metabolic Panel (CMET), TSH, Lipid Panel w/o Chol/HDL Ratio  Screening for STDs (sexually transmitted diseases) - Plan: HIV antibody (with reflex), HSV(herpes simplex vrs) 1+2 ab-IgG, HSV(herpes simplex vrs) 1+2 ab-IgM, RPR, Hepatitis C Antibody, Urine cytology ancillary only  On pre-exposure prophylaxis for HIV- history of not on currently  - Plan: Ambulatory referral to Infectious Disease  GAD (generalized anxiety disorder)  History of COVID-19 - Plan: DG Chest 2 View  Cough    Orders Placed This Encounter  Procedures  .  DG Chest 2 View    Order Specific Question:   Reason for Exam (SYMPTOM  OR DIAGNOSIS REQUIRED)    Answer:   history of covid in january lingering cough.    Order Specific Question:   Preferred imaging location?    Answer:   [facility]  . HIV antibody (with reflex)  . HSV(herpes simplex vrs) 1+2 ab-IgG  . HSV(herpes simplex vrs) 1+2 ab-IgM  . RPR  . Hepatitis C Antibody  . CBC with  Differential/Platelet  . Comprehensive Metabolic Panel (CMET)  . TSH  . Lipid Panel w/o Chol/HDL Ratio  . Ambulatory referral to Infectious Disease    Referral Priority:   Routine    Referral Type:   Consultation    Referral Reason:   Specialty Services Required    Referred to Provider:   Lynn Ito, MD    Requested Specialty:   Infectious Diseases    Number of Visits Requested:   1   He was given specimen cup to return  To front office with dirty catch urine the day he does his labs here.  Return in about 6 months (around 11/04/2020), or if symptoms worsen or fail to improve, for at any time for any worsening symptoms.    Red Flags discussed. The patient was given clear instructions to go to ER or return to medical center if any red flags develop, symptoms do not improve, worsen or new problems develop. They verbalized understanding.   The entirety of the information documented in the History of Present Illness, Review of Systems and Physical Exam were personally obtained by me. Portions of this information were initially documented by the CMA and reviewed by me for thoroughness and accuracy.      Jairo Ben, FNP  [facility] [phone] (phone) [phone] (fax)  [facility]

## 2020-05-07 NOTE — Addendum Note (Signed)
Addended by: Fonda Kinder on: 05/07/2020 02:30 PM   Modules accepted: Orders

## 2020-05-07 NOTE — Progress Notes (Signed)
Urine microscopic if enough to confirm blood.

## 2020-05-08 ENCOUNTER — Other Ambulatory Visit: Payer: Self-pay

## 2020-05-08 ENCOUNTER — Ambulatory Visit
Admission: RE | Admit: 2020-05-08 | Discharge: 2020-05-08 | Disposition: A | Discharge status: Home / Self Care | Payer: 59 | Attending: Adult Health | Admitting: Adult Health

## 2020-05-08 ENCOUNTER — Ambulatory Visit
Admission: RE | Admit: 2020-05-08 | Discharge: 2020-05-08 | Disposition: A | Discharge status: Home / Self Care | Payer: 59 | Source: Ambulatory Visit | Attending: Adult Health | Admitting: Adult Health

## 2020-05-08 ENCOUNTER — Encounter: Payer: Self-pay | Admitting: Adult Health

## 2020-05-08 DIAGNOSIS — R059 Cough, unspecified: Secondary | ICD-10-CM | POA: Insufficient documentation

## 2020-05-08 DIAGNOSIS — Z113 Encounter for screening for infections with a predominantly sexual mode of transmission: Secondary | ICD-10-CM | POA: Diagnosis not present

## 2020-05-08 DIAGNOSIS — Z8616 Personal history of COVID-19: Secondary | ICD-10-CM | POA: Diagnosis not present

## 2020-05-08 DIAGNOSIS — Z0000 Encounter for general adult medical examination without abnormal findings: Secondary | ICD-10-CM | POA: Diagnosis not present

## 2020-05-08 LAB — URINALYSIS, MICROSCOPIC ONLY
Casts: NONE SEEN /lpf
Epithelial Cells (non renal): >10 /hpf — AB (ref 0–10)

## 2020-05-08 NOTE — Progress Notes (Signed)
Chest x ray is within normal limits.

## 2020-05-09 LAB — CBC WITH DIFFERENTIAL/PLATELET: Monocytes: 10 %

## 2020-05-09 LAB — LIPID PANEL W/O CHOL/HDL RATIO: VLDL Cholesterol Cal: 23 mg/dL (ref 5–40)

## 2020-05-09 LAB — COMPREHENSIVE METABOLIC PANEL: Albumin/Globulin Ratio: 1.8 (ref 1.2–2.2)

## 2020-05-09 NOTE — Telephone Encounter (Signed)
See message sent to Mychart

## 2020-05-09 NOTE — Progress Notes (Signed)
HIV is non reactive/ negative. HSV IGM is pending results. RPR for syphilis is negative. Hep C virus screening negative. CBC and CMP  is within normal limits. TSH is within normal limits.  Cholesterol is within normal limits. Bacteria in urine, waiting on urine cytology results. As well as HSV IGG.

## 2020-05-10 LAB — CBC WITH DIFFERENTIAL/PLATELET
Basophils Absolute: 0 10*3/uL (ref 0.0–0.2)
Basos: 0 %
EOS (ABSOLUTE): 0.1 10*3/uL (ref 0.0–0.4)
Eos: 1 %
Hematocrit: 46.1 % (ref 37.5–51.0)
Hemoglobin: 15.5 g/dL (ref 13.0–17.7)
Immature Grans (Abs): 0 10*3/uL (ref 0.0–0.1)
Immature Granulocytes: 0 %
Lymphocytes Absolute: 3.1 10*3/uL (ref 0.7–3.1)
Lymphs: 46 %
MCH: 29.6 pg (ref 26.6–33.0)
MCHC: 33.6 g/dL (ref 31.5–35.7)
MCV: 88 fL (ref 79–97)
Monocytes Absolute: 0.7 10*3/uL (ref 0.1–0.9)
Neutrophils Absolute: 3 10*3/uL (ref 1.4–7.0)
Neutrophils: 43 %
Platelets: 229 10*3/uL (ref 150–450)
RBC: 5.24 x10E6/uL (ref 4.14–5.80)
RDW: 12.8 % (ref 11.6–15.4)
WBC: 6.9 10*3/uL (ref 3.4–10.8)

## 2020-05-10 LAB — COMPREHENSIVE METABOLIC PANEL
ALT: 9 IU/L (ref 0–44)
AST: 15 IU/L (ref 0–40)
Albumin: 4.9 g/dL (ref 4.1–5.2)
Alkaline Phosphatase: 89 IU/L (ref 44–121)
BUN/Creatinine Ratio: 8 — ABNORMAL LOW (ref 9–20)
BUN: 7 mg/dL (ref 6–20)
Bilirubin Total: 0.8 mg/dL (ref 0.0–1.2)
CO2: 22 mmol/L (ref 20–29)
Calcium: 9.8 mg/dL (ref 8.7–10.2)
Chloride: 100 mmol/L (ref 96–106)
Creatinine, Ser: 0.92 mg/dL (ref 0.76–1.27)
GFR calc Af Amer: 137 mL/min/{1.73_m2} (ref >59)
GFR calc non Af Amer: 118 mL/min/{1.73_m2} (ref >59)
Globulin, Total: 2.7 g/dL (ref 1.5–4.5)
Glucose: 80 mg/dL (ref 65–99)
Potassium: 4.4 mmol/L (ref 3.5–5.2)
Sodium: 140 mmol/L (ref 134–144)
Total Protein: 7.6 g/dL (ref 6.0–8.5)

## 2020-05-10 LAB — RPR: RPR Ser Ql: NONREACTIVE

## 2020-05-10 LAB — HSV(HERPES SIMPLEX VRS) I + II AB-IGM: HSVI/II Comb IgM: <0.91 Ratio (ref 0.00–0.90)

## 2020-05-10 LAB — TSH: TSH: 3.87 u[IU]/mL (ref 0.450–4.500)

## 2020-05-10 LAB — LIPID PANEL W/O CHOL/HDL RATIO
Cholesterol, Total: 162 mg/dL (ref 100–199)
HDL: 43 mg/dL (ref >39)
LDL Chol Calc (NIH): 96 mg/dL (ref 0–99)
Triglycerides: 131 mg/dL (ref 0–149)

## 2020-05-10 LAB — HEPATITIS C ANTIBODY: Hep C Virus Ab: <0.1 s/co ratio (ref 0.0–0.9)

## 2020-05-10 LAB — HIV ANTIBODY (ROUTINE TESTING W REFLEX): HIV Screen 4th Generation wRfx: NONREACTIVE

## 2020-05-11 LAB — URINE CYTOLOGY ANCILLARY ONLY
Bacterial Vaginitis-Urine: NEGATIVE
Candida Urine: NEGATIVE
Chlamydia: NEGATIVE
Comment: NEGATIVE
Comment: NEGATIVE
Comment: NORMAL
Neisseria Gonorrhea: NEGATIVE
Trichomonas: NEGATIVE

## 2020-05-11 NOTE — Progress Notes (Signed)
Hsv IGM is negative for acute herpes simplex virus. HSV IGG has not resulted, not sure if it is still pending or not drawn -  we may  need to check with lab.

## 2020-05-11 NOTE — Progress Notes (Signed)
Negative urine cytology screening.

## 2020-05-13 LAB — SPECIMEN STATUS REPORT

## 2020-05-13 LAB — HSV(HERPES SIMPLEX VRS) I + II AB-IGG
HSV 1 Glycoprotein G Ab, IgG: 26.9 index — ABNORMAL HIGH (ref 0.00–0.90)
HSV 2 IgG, Type Spec: <0.91 index (ref 0.00–0.90)

## 2020-05-13 NOTE — Progress Notes (Signed)
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 Negative IGG  past infection for type 2, genital. Follow up if any symptoms.

## 2020-05-18 ENCOUNTER — Telehealth: Payer: Self-pay | Admitting: Cardiovascular Disease

## 2020-05-18 NOTE — Telephone Encounter (Signed)
3 attempts to schedule fu appt from recall list.   Deleting recall.   

## 2020-05-21 ENCOUNTER — Ambulatory Visit: Payer: 59 | Attending: Infectious Diseases | Admitting: Infectious Diseases

## 2020-05-21 ENCOUNTER — Other Ambulatory Visit: Payer: Self-pay

## 2020-05-21 ENCOUNTER — Encounter: Payer: Self-pay | Admitting: Infectious Diseases

## 2020-05-21 ENCOUNTER — Other Ambulatory Visit
Admission: RE | Admit: 2020-05-21 | Discharge: 2020-05-21 | Disposition: A | Discharge status: Home / Self Care | Payer: 59 | Attending: Infectious Diseases | Admitting: Infectious Diseases

## 2020-05-21 VITALS — BP 121/77 | HR 89 | Resp 16 | Ht 67.0 in | Wt 143.0 lb

## 2020-05-21 DIAGNOSIS — Z9189 Other specified personal risk factors, not elsewhere classified: Secondary | ICD-10-CM | POA: Insufficient documentation

## 2020-05-21 DIAGNOSIS — Z79899 Other long term (current) drug therapy: Secondary | ICD-10-CM | POA: Insufficient documentation

## 2020-05-21 DIAGNOSIS — Z7252 High risk homosexual behavior: Secondary | ICD-10-CM | POA: Diagnosis not present

## 2020-05-21 DIAGNOSIS — Z113 Encounter for screening for infections with a predominantly sexual mode of transmission: Secondary | ICD-10-CM | POA: Diagnosis not present

## 2020-05-21 LAB — CHLAMYDIA/NGC RT PCR (ARMC ONLY)
Chlamydia Tr: DETECTED — AB
Chlamydia Tr: NOT DETECTED
N gonorrhoeae: NOT DETECTED
N gonorrhoeae: NOT DETECTED

## 2020-05-21 LAB — HIV ANTIBODY (ROUTINE TESTING W REFLEX): HIV Screen 4th Generation wRfx: NONREACTIVE

## 2020-05-21 LAB — HEPATITIS A ANTIBODY, TOTAL: hep A Total Ab: REACTIVE — AB

## 2020-05-21 NOTE — Patient Instructions (Signed)
You are here to discuss PrEP and start treatment- will do labs hiv, hepa, and gc/chl screen and anal pap Once we have the test back will send the prescription for Descovy to [facility] pharmacy

## 2020-05-21 NOTE — Progress Notes (Signed)
ID PREP Visit NAME: Damon Avila  DOB: [birth year]  MRN: [id number]  Date/Time: 05/21/2020 10:13 AM Subjective:  Pt here for PreP initiation PreP visit Damon Avila is a 20 y.o. male  Here to discuss PreP Risk-[facility] in the past 6 months-3 Unprotected anal/oral/ No Illicit drug use NO Sex with HIV + individual Pt has been on PreP before Had taken Descovy a year ago and then his insurance changed No sid eeffects when he had took it [place] partnr since a few weeks, they both tested neg for HIv at the start of relationship H/o rectal chlamydia before treated In school for EMT. At [facility] ED Past Medical History:  Diagnosis Date  . ADHD (attention deficit hyperactivity disorder), inattentive type   . Allergy   . Anxiety    a. Anxiety episode freshman year in high school.  . Depression   . Sinus tachycardia    a. 04/2017 Echo [facility]): nl LV/RV fxn, structurally nl heart; b. 05/2017 Event monitor (2 wks): Avg rate 82 bpm, Min HR 47, max HR 162. Predominantly sinus rhythm. Isolated SVE's/couplets (rare - <1%).    Past Surgical History:  Procedure Laterality Date  . TONSILLECTOMY      Social History   Socioeconomic History  . Marital status: Single    Spouse name: Not on file  . Number of children: Not on file  . Years of education: Not on file  . Highest education level: Not on file  Occupational History  . Not on file  Tobacco Use  . Smoking status: Never Smoker  . Smokeless tobacco: Never Used  Vaping Use  . Vaping Use: Never used  Substance and Sexual Activity  . Alcohol use: Yes    Alcohol/week: 2.0 - 3.0 standard drinks    Types: 2 - 3 Glasses of wine per week  . Drug use: No  . Sexual activity: Not on file  Other Topics Concern  . Not on file  Social History Narrative  . Not on file   Social Determinants of Health   Financial Resource Strain: Not on file  Food Insecurity: Not on file  Transportation Needs: Not on file  Physical Activity: Not on file   Stress: Not on file  Social Connections: Not on file  Intimate Partner Violence: Not on file    Family History  Problem Relation Age of Onset  . Depression Mother   . Diabetes Mother        Type 2  . Depression Father   . COPD Maternal Grandmother   . Bipolar disorder Maternal Grandmother   . Cancer Maternal Grandfather   . Cancer Paternal Grandmother    No Known Allergies ? Current Outpatient Medications  Medication Sig Dispense Refill  . hydrOXYzine (ATARAX/VISTARIL) 10 MG tablet Take 10 mg by mouth 3 (three) times daily as needed.     No current facility-administered medications for this visit.    REVIEW OF SYSTEMS:  Const: negative fever, negative chills, negative weight loss Eyes: negative diplopia or visual changes, negative eye pain ENT: negative coryza, negative sore throat Resp: negative cough, hemoptysis, dyspnea Cards: negative for chest pain, palpitations, lower extremity edema GU: negative for frequency, dysuria and hematuria Skin: negative for rash and pruritus Heme: negative for easy bruising and gum/nose bleeding MS: negative for myalgias, arthralgias, back pain and muscle weakness Neurolo:negative for headaches, dizziness, vertigo, memory problems  Psych: anxiety ? Objective:  VITALS:  BP 121/77   Pulse 89   Resp 16  Ht 5\' 7"  (1.702 m)   Wt 143 lb (64.9 kg)   SpO2 99%   BMI 22.40 kg/m  PHYSICAL EXAM:  General: Alert, cooperative, no distress, appears stated age.  Head: Normocephalic, without obvious abnormality, atraumatic. Eyes: Conjunctivae clear, anicteric sclerae. Pupils are equal Nose: Nares normal. No drainage or sinus tenderness. Throat: Lips, mucosa, and tongue normal. No Thrush Neck: Supple, symmetrical, no adenopathy, thyroid: non tender no carotid bruit and no JVD. Back: No CVA tenderness. Lungs: Clear to auscultation bilaterally. No Wheezing or Rhonchi. No rales. Heart: Regular rate and rhythm, no murmur, rub or gallop. Abdomen:  Soft, non-tender,not distended. Bowel sounds normal. No masses Extremities: Extremities normal, atraumatic, no cyanosis. No edema. No clubbing Skin: No rashes or lesions. Not Jaundiced Lymph: Cervical, supraclavicular normal. Neurologic: Grossly non-focal Pertinent Labs ? IMAGING RESULTS: Pre-Prescription  HIV NR 05/08/20 CMP-05/08/20 HBV serology ( HBsag, S ab , core antibody)- has been vaccinated an dgot a booster when he joined [employer] HAV HCV-NR RPR/ GC/CHL  Impression/Recommendation Here to intiate Pre exposure prophylaxis for HIV with Descovy Discussed his risk factors, efficacy of PrEP, condoms to prevent other STDS, adherence, side effects of Descovy , need to wait for 20 days after starting Descovy if he has to engage in insertive anal sex  Discussed how to recognize newly acquired HIv infection Baseline labs HIV-NR, HEP B immunity, creatinine, LFTS N Will do RPR, GC chl ( , throat and rectal) HEp A  Anap pap done today Discussed vaccination- HPV ( vaccinated- he will get the records), and hepatitis A Will send prescription for descovy once I have HIv test result today Discussed the management in detail with patient Follow up 3 months ?

## 2020-05-22 ENCOUNTER — Telehealth: Payer: Self-pay | Admitting: Pharmacist

## 2020-05-22 ENCOUNTER — Ambulatory Visit (HOSPITAL_BASED_OUTPATIENT_CLINIC_OR_DEPARTMENT_OTHER): Payer: 59 | Admitting: Pharmacist

## 2020-05-22 ENCOUNTER — Other Ambulatory Visit: Payer: Self-pay | Admitting: Infectious Diseases

## 2020-05-22 DIAGNOSIS — Z79899 Other long term (current) drug therapy: Secondary | ICD-10-CM

## 2020-05-22 MED ORDER — DESCOVY 200-25 MG PO TABS: 1.0000 | ORAL_TABLET | Freq: Every day | ORAL | 2 refills | Status: DC

## 2020-05-22 MED ORDER — AZITHROMYCIN 500 MG PO TABS: 1000.0000 mg | ORAL_TABLET | Freq: Once | ORAL | 0 refills | Status: DC

## 2020-05-22 MED FILL — DESCOVY 200-25 MG TABS: 200-25 | 30 days supply | Qty: 30 | Fill #0

## 2020-05-22 MED FILL — AZITHROMYCIN 500 MG TABS: 500 | 1 days supply | Qty: 2 | Fill #0

## 2020-05-22 NOTE — Telephone Encounter (Signed)
Called patient to schedule an appointment for the [employer] [facility]. I was unable to reach the patient so I left a HIPAA-compliant message requesting that the patient return my call.   Luke Van Ausdall, PharmD, BCACP, CPP Clinical Pharmacist [facility] [phone]  

## 2020-05-22 NOTE — Progress Notes (Signed)
   S: Patient presents for review of their specialty medication therapy.    Patient is currently prescribed Descovy for PrEP. Patient is managed by Dr. Rivka Safer for this.  Pt has taken this before with no issue, however, stopped briefly do to switching insurances.    Adherence: has not restarted yet. Waiting for pharmacy.   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  Dosing: Renal Impairment  Note: Renal function may be estimated using the Cockcroft-Gault formula for dosage adjustment purposes. CrCl ?30 mL/minute: No dosage adjustment necessary. CrCl <30 mL/minute: Use is not recommended. End-stage renal disease on hemodialysis: One tablet (emtricitabine 200 mg/tenofovir alafenamide 25 mg) once daily; administer after hemodialysis on dialysis days (HHS [adult] 2019).  Dosing: Hepatic Impairment  Mild-to-moderate impairment (Child-Pugh class A or B): No dosage adjustment necessary.  Severe impairment (Child-Pugh class C): There are no dosage adjustments provided in the manufacturer's labeling (has not been studied).  Drug-drug interactions: none identified   Monitoring: Negative HIV test (if using for PrEP): confirmed; NR result 05/21/2020.  Headache: denies when using the medication GI upset: denies when using the medication S/sx decreased bone mineral density: denies when using the medication Neuropsychiatric s/sx: denies when using the medication Skin reactions: denies when using the medication     O:     Lab Results  Component Value Date   WBC 6.9 05/08/2020   HGB 15.5 05/08/2020   HCT 46.1 05/08/2020   MCV 88 05/08/2020   PLT 229 05/08/2020      Chemistry      Component Value Date/Time   NA 140 05/08/2020 0856   NA 138 06/24/2013 0835   K 4.4 05/08/2020 0856   K 3.7 06/24/2013 0835   CL 100 05/08/2020 0856   CL 106 06/24/2013 0835   CO2 22 05/08/2020 0856   CO2 27 (H)  06/24/2013 0835   BUN 7 05/08/2020 0856   BUN 10 06/24/2013 0835   CREATININE 0.92 05/08/2020 0856   CREATININE 0.79 06/24/2013 0835      Component Value Date/Time   CALCIUM 9.8 05/08/2020 0856   CALCIUM 9.7 06/24/2013 0835   ALKPHOS 89 05/08/2020 0856   ALKPHOS 140 (H) 06/24/2013 0835   AST 15 05/08/2020 0856   AST 28 06/24/2013 0835   ALT 9 05/08/2020 0856   ALT 26 06/24/2013 0835   BILITOT 0.8 05/08/2020 0856   BILITOT 0.4 06/24/2013 0835       No results found for: CD4TCELL, CD4TABS  No results found for: HIV1RNAQUANT   A/P: 1. Medication review: patient is on Descovy for PrEP. Reviewed the medication with the patient, including the following: Atripla is a combination medication used for the treatment or prevention of HIV. Patient educated on purpose, proper use and potential adverse effects of Descovy. Administer with or without food. Possible adverse effects including metabolic disturbances, headache, insomnia/abnormal dreams, skin reactions, immune reconstitution syndrome, lactic acidosis, and decreased bone mineral density. Adherence is crucial when using this drug to avoid mutations and resistance for those for HIV and crucial to lessen the risk of contracting HIV for those using it as preventive treatment. No recommendations for changes.  Butch Penny, PharmD, P[place]y [place], CPP Clinical Pharmacist [facility] [phone]

## 2020-05-27 ENCOUNTER — Encounter: Payer: Self-pay | Admitting: Adult Health

## 2020-05-29 ENCOUNTER — Other Ambulatory Visit: Payer: Self-pay | Admitting: Adult Health

## 2020-05-29 DIAGNOSIS — Z0000 Encounter for general adult medical examination without abnormal findings: Secondary | ICD-10-CM

## 2020-05-29 NOTE — Progress Notes (Signed)
Orders Placed This Encounter  Procedures  . Ambulatory referral to Oral Maxillofacial Surgery    Referral Priority:   Routine    Referral Type:   Surgical    Referral Reason:   Specialty Services Required    Requested Specialty:   Oral Surgery    Number of Visits Requested:   1  Evaluation by medical service required - Plan: Ambulatory referral to Oral Maxillofacial Surgery patient request per his dentist.

## 2020-06-18 MED FILL — DESCOVY 200-25 MG TABS: 200-25 | 30 days supply | Qty: 30 | Fill #1

## 2020-07-16 ENCOUNTER — Other Ambulatory Visit (HOSPITAL_COMMUNITY): Payer: Self-pay

## 2020-07-16 MED FILL — Emtricitabine-Tenofovir Alafenamide Fumarate Tab 200-25 MG: ORAL | 30 days supply | Qty: 30 | Fill #0 | Status: CN

## 2020-08-12 ENCOUNTER — Other Ambulatory Visit (HOSPITAL_COMMUNITY): Payer: Self-pay

## 2020-08-18 ENCOUNTER — Ambulatory Visit: Payer: 59 | Admitting: Infectious Diseases

## 2020-08-26 ENCOUNTER — Other Ambulatory Visit (HOSPITAL_COMMUNITY): Payer: Self-pay

## 2020-09-10 ENCOUNTER — Ambulatory Visit: Payer: 59 | Admitting: Infectious Diseases

## 2020-10-07 ENCOUNTER — Other Ambulatory Visit (HOSPITAL_COMMUNITY): Payer: Self-pay

## 2020-12-01 ENCOUNTER — Other Ambulatory Visit (HOSPITAL_COMMUNITY): Payer: Self-pay

## 2021-02-24 ENCOUNTER — Other Ambulatory Visit: Payer: Self-pay

## 2021-02-24 ENCOUNTER — Encounter: Payer: Self-pay | Admitting: Emergency Medicine

## 2021-02-24 ENCOUNTER — Ambulatory Visit
Admission: EM | Admit: 2021-02-24 | Discharge: 2021-02-24 | Disposition: A | Discharge status: Home / Self Care | Payer: 59 | Attending: Family Medicine | Admitting: Family Medicine

## 2021-02-24 DIAGNOSIS — J069 Acute upper respiratory infection, unspecified: Secondary | ICD-10-CM | POA: Diagnosis not present

## 2021-02-24 MED ORDER — PROMETHAZINE-DM 6.25-15 MG/5ML PO SYRP: 5.0000 mL | ORAL_SOLUTION | Freq: Four times a day (QID) | ORAL | 0 refills | Status: DC | PRN

## 2021-02-24 NOTE — ED Triage Notes (Addendum)
Patient c/o fever, headache, and sore throat x 1 day.   Patient endorses generalized body aches.   Patient endorses a temperature of 100.8 F at home.   Patient endorses difficulty with inhalation.   Patient endorses nonproductive cough at times.   Patient has taken Tylenol, Nyquil, and Dayquil.

## 2021-02-24 NOTE — ED Provider Notes (Signed)
[facility]   [id number] 02/24/21 Arrival Time: 1235  ASSESSMENT & PLAN:  1. Viral URI with cough    Discussed typical duration of viral illnesses. COVID-19/influenza testing sent. OTC symptom care as needed.  Meds ordered this encounter  Medications   promethazine-dextromethorphan (PROMETHAZINE-DM) 6.25-15 MG/5ML syrup    Sig: Take 5 mLs by mouth 4 (four) times daily as needed for cough.    Dispense:  118 mL    Refill:  0     Follow-up Information     Flinchum, Eula Fried, FNP.   Specialty: Family Medicine Why: As needed. Contact information: [address] Laurell Josephs [address] [place] [address] [phone]                 Reviewed expectations re: course of current medical issues. Questions answered. Outlined signs and symptoms indicating need for more acute intervention. Understanding verbalized. After Visit Summary given.   SUBJECTIVE: History from: patient. Damon Avila is a 20 y.o. male who reports: fever, HA, ST, mild cough. Roommate with similar. Some heaviness of chest. No wheezing. Non-smoker. Normal PO intake without n/v/d.   OBJECTIVE:  Vitals:   02/24/21 1432  BP: 115/65  Pulse: 74  Resp: 18  Temp: 99 F (37.2 C)  TempSrc: Oral  SpO2: 97%    General appearance: alert; no distress Eyes: PERRLA; EOMI; conjunctiva normal HENT: [place]; AT; with nasal congestion Neck: supple  Lungs: speaks full sentences without difficulty; unlabored; CTAB Extremities: no edema Skin: warm and dry Neurologic: normal gait Psychological: alert and cooperative; normal mood and affect  No Known Allergies  Past Medical History:  Diagnosis Date   ADHD (attention deficit hyperactivity disorder), inattentive type    Allergy    Anxiety    a. Anxiety episode freshman year in high school.   Depression    Sinus tachycardia    a. 04/2017 Echo [facility]): nl LV/RV fxn, structurally nl heart; b. 05/2017 Event monitor (2 wks): Avg rate 82 bpm, Min HR 47, max HR  162. Predominantly sinus rhythm. Isolated SVE's/couplets (rare - <1%).   Social History   Socioeconomic History   Marital status: Significant Other    Spouse name: Not on file   Number of children: Not on file   Years of education: Not on file   Highest education level: Not on file  Occupational History   Not on file  Tobacco Use   Smoking status: Never   Smokeless tobacco: Never  Vaping Use   Vaping Use: Never used  Substance and Sexual Activity   Alcohol use: Yes    Alcohol/week: 2.0 - 3.0 standard drinks    Types: 2 - 3 Glasses of wine per week   Drug use: No   Sexual activity: Not on file  Other Topics Concern   Not on file  Social History Narrative   Not on file   Social Determinants of Health   Financial Resource Strain: Not on file  Food Insecurity: Not on file  Transportation Needs: Not on file  Physical Activity: Not on file  Stress: Not on file  Social Connections: Not on file  Intimate Partner Violence: Not on file   Family History  Problem Relation Age of Onset   Depression Mother    Diabetes Mother        Type 2   Depression Father    COPD Maternal Grandmother    Bipolar disorder Maternal Grandmother    Cancer Maternal Grandfather    Cancer Paternal Grandmother  Past Surgical History:  Procedure Laterality Date   TONSILLECTOMY       Mardella Layman, MD 02/24/21 1501

## 2021-02-24 NOTE — Discharge Instructions (Signed)
You have been tested for COVID-19/influenza today. If your test returns positive, you will receive a phone call from [place] [facility] regarding your results. Negative test results are not called. Both positive and negative results area always visible on MyChart. If you do not have a MyChart account, sign up instructions are provided in your discharge papers. Please do not hesitate to contact us should you have questions or concerns.  

## 2021-02-26 LAB — COVID-19, FLU A+B NAA
Influenza A, NAA: NOT DETECTED
Influenza B, NAA: NOT DETECTED
SARS-CoV-2, NAA: NOT DETECTED

## 2021-05-18 ENCOUNTER — Ambulatory Visit (HOSPITAL_BASED_OUTPATIENT_CLINIC_OR_DEPARTMENT_OTHER): Payer: 59 | Admitting: Family Medicine

## 2021-05-18 ENCOUNTER — Other Ambulatory Visit: Payer: Self-pay

## 2021-05-18 ENCOUNTER — Encounter (HOSPITAL_BASED_OUTPATIENT_CLINIC_OR_DEPARTMENT_OTHER): Payer: Self-pay | Admitting: Family Medicine

## 2021-05-18 ENCOUNTER — Other Ambulatory Visit (HOSPITAL_BASED_OUTPATIENT_CLINIC_OR_DEPARTMENT_OTHER): Payer: Self-pay | Admitting: Family Medicine

## 2021-05-18 VITALS — BP 128/82 | HR 97 | Ht 67.0 in | Wt 154.0 lb

## 2021-05-18 DIAGNOSIS — J302 Other seasonal allergic rhinitis: Secondary | ICD-10-CM | POA: Insufficient documentation

## 2021-05-18 DIAGNOSIS — Z0000 Encounter for general adult medical examination without abnormal findings: Secondary | ICD-10-CM

## 2021-05-18 NOTE — Progress Notes (Signed)
New Patient Office Visit  Subjective:  Patient ID: Damon Avila, male    DOB: 2000-07-25  Age: 21 y.o. MRN: [id number]  CC:  Chief Complaint  Patient presents with   New Patient (Initial Visit)    Patient presents today establish care. 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 He stated he does an OTC cocktail for allergies with no help or it dries him up and he has nose bleeds. He would like to discuss allergy shots.     HPI Damon Avila is a 21 year old male presenting to establish clinic.  He has current concerns as outlined above.  Denies significant past medical history.  Allergies: Reports that primarily over the past month he has had worsening allergy symptoms.  He reports watery, itchy eyes, scratchy throat, rhinorrhea.  Has been using Zyrtec, Flonase twice daily, Benadryl as needed.  Feels that these medications try out his sinuses and then he will have occasional nosebleeds.  In the past he has tried Mucinex and Afrin.  Patient is originally from [place], [place].  Works as an Public relations account executive.  Outside of work he enjoys gardening, exercising.  Past Medical History:  Diagnosis Date   ADHD (attention deficit hyperactivity disorder), inattentive type    Allergy    Anxiety    a. Anxiety episode freshman year in high school.   Depression    Sinus tachycardia    a. 04/2017 Echo [facility]): nl LV/RV fxn, structurally nl heart; b. 05/2017 Event monitor (2 wks): Avg rate 82 bpm, Min HR 47, max HR 162. Predominantly sinus rhythm. Isolated SVE's/couplets (rare - <1%).    Past Surgical History:  Procedure Laterality Date   TONSILLECTOMY      Family History  Problem Relation Age of Onset   Depression Mother    Diabetes Mother        Type 2   Depression Father    COPD Maternal Grandmother    Bipolar disorder Maternal Grandmother    Cancer Maternal Grandfather    Cancer Paternal Grandmother     Social History   Socioeconomic History   Marital status: Significant  Other    Spouse name: Not on file   Number of children: Not on file   Years of education: Not on file   Highest education level: Not on file  Occupational History   Not on file  Tobacco Use   Smoking status: Never   Smokeless tobacco: Never  Vaping Use   Vaping Use: Never used  Substance and Sexual Activity   Alcohol use: Yes    Alcohol/week: 2.0 - 3.0 standard drinks    Types: 2 - 3 Glasses of wine per week   Drug use: No   Sexual activity: Not on file  Other Topics Concern   Not on file  Social History Narrative   Not on file   Social Determinants of Health   Financial Resource Strain: Not on file  Food Insecurity: Not on file  Transportation Needs: Not on file  Physical Activity: Not on file  Stress: Not on file  Social Connections: Not on file  Intimate Partner Violence: Not on file    Objective:   Today's Vitals: BP 128/82    Pulse 97    Ht 5\' 7"  (1.702 m)    Wt 154 lb (69.9 kg)    SpO2 97%    BMI 24.12 kg/m   Physical Exam  21 year old male in no acute distress Cardiovascular exam with  regular rate and rhythm, no murmur appreciated Lungs clear to auscultation bilaterally Nasal exam with normal-appearing turbinates, no current nasal discharge or rhinorrhea, no current bleeding noted  Assessment & Plan:   Problem List Items Addressed This Visit       Other   Seasonal allergies - Primary    Discussed options with patient Can continue with oral antihistamine with use of Zyrtec, can continue with intranasal steroid spray as well Recommend using nasal saline spray to help with moistening mucous membranes given occasional nosebleeds.  Instructed on using this with the Flonase, utilize prior to [facility] administration We will also proceed with referral to allergist in case symptoms persist despite above adjustments      Other Visit Diagnoses     Wellness examination       Relevant Orders   Lipid Profile   Basic Metabolic Panel (BMET)   Ambulatory  referral to Allergy       Outpatient Encounter Medications as of 05/18/2021  Medication Sig   promethazine-dextromethorphan (PROMETHAZINE-DM) 6.25-15 MG/5ML syrup Take 5 mLs by mouth 4 (four) times daily as needed for cough.   [DISCONTINUED] metoprolol succinate (TOPROL-XL) 25 MG 24 hr tablet Take 1 tablet (25 mg total) by mouth daily.   No facility-administered encounter medications on file as of 05/18/2021.    Follow-up: Return in about 2 months (around 07/16/2021).  Plan for follow-up in about 2 months for CPE, we will complete labs about 1 week prior at nurse visit  Lauran Romanski J [place], MD

## 2021-05-18 NOTE — Patient Instructions (Signed)
°  Medication Instructions:  Your physician recommends that you continue on your current medications as directed. Please refer to the Current Medication list given to you today. --If you need a refill on any your medications before your next appointment, please call your pharmacy first. If no refills are authorized on file call the office.-- Lab Work: Your physician has recommended that you have lab work today BMT, Lipid If you have labs (blood work) drawn today and your tests are completely normal, you will receive your results via MyChart message OR a phone call from our staff.  Please ensure you check your voicemail in the event that you authorized detailed messages to be left on a delegated number. If you have any lab test that is abnormal or we need to change your treatment, we will call you to review the results.  Referrals/Procedures/Imaging: [facility]  Follow-Up: Your next appointment:  1 week nurse visit prior to visit Your physician recommends that you schedule a follow-up appointment in: 2 months CPE with Dr. de [place]  You will receive a text message or e-mail with a link to a survey about your care and experience with [place] today! We would greatly appreciate your feedback!   Thanks for letting us be apart of your health journey!!  Primary Care and Sports Medicine   Dr. Ceasar Mons [place]   We encourage you to activate your patient portal called "MyChart".  Sign up information is provided on this After Visit Summary.  MyChart is used to connect with patients for Virtual Visits (Telemedicine).  Patients are able to view lab/test results, encounter notes, upcoming appointments, etc.  Non-urgent messages can be sent to your provider as well. To learn more about what you can do with MyChart, please visit --  [email or web address].

## 2021-05-18 NOTE — Assessment & Plan Note (Signed)
Discussed options with patient Can continue with oral antihistamine with use of Zyrtec, can continue with intranasal steroid spray as well Recommend using nasal saline spray to help with moistening mucous membranes given occasional nosebleeds.  Instructed on using this with the Flonase, utilize prior to [facility] administration We will also proceed with referral to allergist in case symptoms persist despite above adjustments

## 2021-05-19 LAB — BASIC METABOLIC PANEL
BUN/Creatinine Ratio: 14 (ref 9–20)
BUN: 13 mg/dL (ref 6–20)
CO2: 24 mmol/L (ref 20–29)
Calcium: 10.3 mg/dL — ABNORMAL HIGH (ref 8.7–10.2)
Chloride: 105 mmol/L (ref 96–106)
Creatinine, Ser: 0.92 mg/dL (ref 0.76–1.27)
Glucose: 68 mg/dL — ABNORMAL LOW (ref 70–99)
Potassium: 4.6 mmol/L (ref 3.5–5.2)
Sodium: 141 mmol/L (ref 134–144)
eGFR: 121 mL/min/{1.73_m2} (ref >59)

## 2021-05-19 LAB — LIPID PANEL
Chol/HDL Ratio: 4.7 ratio (ref 0.0–5.0)
Cholesterol, Total: 198 mg/dL (ref 100–199)
HDL: 42 mg/dL (ref >39)
LDL Chol Calc (NIH): 134 mg/dL — ABNORMAL HIGH (ref 0–99)
Triglycerides: 122 mg/dL (ref 0–149)
VLDL Cholesterol Cal: 22 mg/dL (ref 5–40)

## 2021-06-16 ENCOUNTER — Ambulatory Visit (HOSPITAL_BASED_OUTPATIENT_CLINIC_OR_DEPARTMENT_OTHER): Payer: Self-pay | Admitting: Family Medicine

## 2021-09-22 ENCOUNTER — Ambulatory Visit (HOSPITAL_BASED_OUTPATIENT_CLINIC_OR_DEPARTMENT_OTHER): Payer: 59 | Admitting: Family Medicine

## 2021-10-30 ENCOUNTER — Encounter: Payer: Self-pay | Admitting: Emergency Medicine

## 2021-10-30 ENCOUNTER — Ambulatory Visit
Admission: EM | Admit: 2021-10-30 | Discharge: 2021-10-30 | Disposition: A | Discharge status: Home / Self Care | Payer: 59 | Attending: Nurse Practitioner | Admitting: Nurse Practitioner

## 2021-10-30 DIAGNOSIS — H1032 Unspecified acute conjunctivitis, left eye: Secondary | ICD-10-CM | POA: Diagnosis not present

## 2021-10-30 MED ORDER — POLYMYXIN B-TRIMETHOPRIM 10000-0.1 UNIT/ML-% OP SOLN: 1.0000 [drp] | Freq: Four times a day (QID) | OPHTHALMIC | 0 refills | Status: AC

## 2021-10-30 NOTE — ED Triage Notes (Signed)
Left eye soreness that started last night.  Woke up this morning left eye was swollen. Denies any vision changes.

## 2021-10-30 NOTE — ED Provider Notes (Signed)
[facility]    CSN: [id number] Arrival date & time: 10/30/21  0920      History   Chief Complaint No chief complaint on file.   HPI Damon Avila is a 21 y.o. male.   The history is provided by the patient.   Patient presents for complaints of left eye swelling and pain that started 1 day ago.  Patient states this morning when he woke up, the left eye was "crusted shut".  He denies headache, visual changes, fever, chills, cough, congestion, runny nose, or ear pain.  Patient wears glasses.  He denies any known sick contacts.    Past Medical History:  Diagnosis Date   ADHD (attention deficit hyperactivity disorder), inattentive type    Allergy    Anxiety    a. Anxiety episode freshman year in high school.   Depression    Sinus tachycardia    a. 04/2017 Echo [facility]): nl LV/RV fxn, structurally nl heart; b. 05/2017 Event monitor (2 wks): Avg rate 82 bpm, Min HR 47, max HR 162. Predominantly sinus rhythm. Isolated SVE's/couplets (rare - <1%).    Patient Active Problem List   Diagnosis Date Noted   Seasonal allergies 05/18/2021   Routine health maintenance 05/07/2020   On pre-exposure prophylaxis for HIV- history of not on currently  05/07/2020   History of COVID-19 05/07/2020   Cough 05/07/2020   Tachycardia 05/15/2017   Attention deficit hyperactivity disorder (ADHD), combined type 06/26/2013   GAD (generalized anxiety disorder) 06/26/2013   MDD (major depressive disorder), single episode, severe (HCC) 06/25/2013    Past Surgical History:  Procedure Laterality Date   TONSILLECTOMY         Home Medications    Prior to Admission medications   Medication Sig Start Date End Date Taking? Authorizing Provider  trimethoprim-polymyxin b (POLYTRIM) ophthalmic solution Place 1 drop into both eyes every 6 (six) hours for 7 days. 10/30/21 11/06/21 Yes Sharonann Malbrough-Warren, Sadie Haber, NP  metoprolol succinate (TOPROL-XL) 25 MG 24 hr tablet Take 1 tablet (25 mg total) by mouth  daily. 08/04/17 12/16/18  Creig Hines, NP    Family History Family History  Problem Relation Age of Onset   Depression Mother    Diabetes Mother        Type 2   Depression Father    COPD Maternal Grandmother    Bipolar disorder Maternal Grandmother    Cancer Maternal Grandfather    Cancer Paternal Grandmother     Social History Social History   Tobacco Use   Smoking status: Never   Smokeless tobacco: Never  Vaping Use   Vaping Use: Every day   Substances: Nicotine  Substance Use Topics   Alcohol use: Yes    Alcohol/week: 2.0 - 3.0 standard drinks of alcohol    Types: 2 - 3 Glasses of wine per week   Drug use: No     Allergies   Patient has no known allergies.   Review of Systems Review of Systems Per HPI  Physical Exam Triage Vital Signs ED Triage Vitals  Enc Vitals Group     BP 10/30/21 0926 123/71     Pulse Rate 10/30/21 0926 71     Resp 10/30/21 0926 18     Temp 10/30/21 0926 98.3 F (36.8 C)     Temp Source 10/30/21 0926 Oral     SpO2 10/30/21 0926 98 %     Weight --      Height --  Head Circumference --      Peak Flow --      Pain Score 10/30/21 0927 0     Pain Loc --      Pain Edu? --      Excl. in GC? --    No data found.  Updated Vital Signs BP 123/71 (BP Location: Right Arm)   Pulse 71   Temp 98.3 F (36.8 C) (Oral)   Resp 18   SpO2 98%   Visual Acuity Right Eye Distance:   Left Eye Distance:   Bilateral Distance:    Right Eye Near:   Left Eye Near:    Bilateral Near:     Physical Exam Vitals and nursing note reviewed.  Constitutional:      General: He is not in acute distress.    Appearance: Normal appearance.  HENT:     Head: Normocephalic.     Right Ear: Tympanic membrane, ear canal and external ear normal.     Left Ear: Tympanic membrane, ear canal and external ear normal.     Nose: Nose normal.     Mouth/Throat:     Mouth: Mucous membranes are moist.     Pharynx: No oropharyngeal exudate or  posterior oropharyngeal erythema.  Eyes:     General: Vision grossly intact. Gaze aligned appropriately. No visual field deficit.       Left eye: Discharge present.No foreign body or hordeolum.     Extraocular Movements: Extraocular movements intact.     Left eye: Normal extraocular motion and no nystagmus.     Conjunctiva/sclera:     Left eye: Left conjunctiva is not injected. Exudate present. No chemosis or hemorrhage.    Pupils: Pupils are equal, round, and reactive to light.     Comments: Swelling noted to the left upper eyelid.  Drainage present to the left eye.  Cardiovascular:     Rate and Rhythm: Normal rate and regular rhythm.     Pulses: Normal pulses.     Heart sounds: Normal heart sounds.  Pulmonary:     Effort: Pulmonary effort is normal.     Breath sounds: Normal breath sounds.  Abdominal:     General: Bowel sounds are normal.     Palpations: Abdomen is soft.  Musculoskeletal:     Cervical back: Normal range of motion.  Lymphadenopathy:     Cervical: No cervical adenopathy.  Skin:    General: Skin is warm and dry.  Neurological:     General: No focal deficit present.     Mental Status: He is alert and oriented to person, place, and time.  Psychiatric:        Mood and Affect: Mood normal.        Behavior: Behavior normal.      UC Treatments / Results  Labs (all labs ordered are listed, but only abnormal results are displayed) Labs Reviewed - No data to display  EKG   Radiology No results found.  Procedures Procedures (including critical care time)  Medications Ordered in UC Medications - No data to display  Initial Impression / Assessment and Plan / UC Course  I have reviewed the triage vital signs and the nursing notes.  Pertinent labs & imaging results that were available during my care of the patient were reviewed by me and considered in my medical decision making (see chart for details).  Patient presents for complaints of left eye symptoms  that started 1 day ago.  On exam, patient has swelling  to the left upper eyelid with crusting present.  Still patient's current presentation, will treat prophylactically for bacterial conjunctivitis.  Will start patient on Polytrim eyedrops.  Supportive care recommendations were provided to the patient with strict indications of when to go to the emergency department.  Advised to follow-up as needed. Final Clinical Impressions(s) / UC Diagnoses   Final diagnoses:  Acute conjunctivitis of left eye, unspecified acute conjunctivitis type     Discharge Instructions      Use eyedrops as prescribed.  Recommend using them in both eyes in the event that the infection spreads. Cool compresses to the eyes to help with pain or swelling. May continue the over-the-counter eyedrops you are using to help keep the eyes moist and decreased redness. Strict handwashing when applying medication.  Avoid rubbing or manipulating the eyes while symptoms persist. Go to the emergency department immediately if you develop sudden change in vision, loss of vision, or other concerns. Follow-up if symptoms do not improve.      ED Prescriptions     Medication Sig Dispense Auth. Provider   trimethoprim-polymyxin b (POLYTRIM) ophthalmic solution Place 1 drop into both eyes every 6 (six) hours for 7 days. 10 mL Heman Que-Warren, Sadie Haber, NP      PDMP not reviewed this encounter.   Abran Cantor, NP 10/30/21 1001

## 2021-10-30 NOTE — Discharge Instructions (Signed)
Use eyedrops as prescribed.  Recommend using them in both eyes in the event that the infection spreads. Cool compresses to the eyes to help with pain or swelling. May continue the over-the-counter eyedrops you are using to help keep the eyes moist and decreased redness. Strict handwashing when applying medication.  Avoid rubbing or manipulating the eyes while symptoms persist. Go to the emergency department immediately if you develop sudden change in vision, loss of vision, or other concerns. Follow-up if symptoms do not improve.

## 2022-03-19 ENCOUNTER — Ambulatory Visit
Admission: EM | Admit: 2022-03-19 | Discharge: 2022-03-19 | Disposition: A | Discharge status: Home / Self Care | Payer: 59

## 2022-03-19 DIAGNOSIS — R6889 Other general symptoms and signs: Secondary | ICD-10-CM

## 2022-03-19 MED ORDER — PROMETHAZINE-DM 6.25-15 MG/5ML PO SYRP: 5.0000 mL | ORAL_SOLUTION | Freq: Four times a day (QID) | ORAL | 0 refills | Status: DC | PRN

## 2022-03-19 MED ORDER — OSELTAMIVIR PHOSPHATE 75 MG PO CAPS: 75.0000 mg | ORAL_CAPSULE | Freq: Two times a day (BID) | ORAL | 0 refills | Status: DC

## 2022-03-19 NOTE — ED Triage Notes (Signed)
Pt reports  nasal congestion, cough, sneezing x 3 days; nausea, vomiting, diarrhea, chills x 1 day. 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 Saline spray, Tylenol, ibuprofen, DayQuil and NyQuil gives some relief  Pt had negative COVID test at home.

## 2022-03-19 NOTE — ED Provider Notes (Signed)
[facility]    CSN: [id number] Arrival date & time: 03/19/22  1145      History   Chief Complaint Chief Complaint  Patient presents with   URI    Entered by patient   Cough    HPI Damon Avila is a 21 y.o. male.   The history is provided by the patient.   Patient presents for complaints of nasal congestion, cough, sneezing that started over the past 3 days.  He states over the past 24 hours, he has developed nausea, vomiting, diarrhea, and chills.  Patient reports recent travel to the [place], and also states that he had recent exposure to flu/COVID and RSV.  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  He has been using normal saline nasal spray, Tylenol, ibuprofen, DayQuil, and NyQuil for his symptoms.  He reports that he came specifically for flu and RSV testing today.  Past Medical History:  Diagnosis Date   ADHD (attention deficit hyperactivity disorder), inattentive type    Allergy    Anxiety    a. Anxiety episode freshman year in high school.   Depression    Sinus tachycardia    a. 04/2017 Echo [facility]): nl LV/RV fxn, structurally nl heart; b. 05/2017 Event monitor (2 wks): Avg rate 82 bpm, Min HR 47, max HR 162. Predominantly sinus rhythm. Isolated SVE's/couplets (rare - <1%).    Patient Active Problem List   Diagnosis Date Noted   Seasonal allergies 05/18/2021   Routine health maintenance 05/07/2020   On pre-exposure prophylaxis for HIV- history of not on currently  05/07/2020   History of COVID-19 05/07/2020   Cough 05/07/2020   Tachycardia 05/15/2017   Attention deficit hyperactivity disorder (ADHD), combined type 06/26/2013   GAD (generalized anxiety disorder) 06/26/2013   MDD (major depressive disorder), single episode, severe (HCC) 06/25/2013    Past Surgical History:  Procedure Laterality Date   TONSILLECTOMY         Home Medications    Prior to Admission medications   Medication Sig Start Date End Date Taking? Authorizing Provider   acetaminophen (TYLENOL) 500 MG tablet Take 500 mg by mouth every 6 (six) hours as needed.   Yes [provider]  Doxylamine-DM (VICKS DAYQUIL/NYQUIL COUGH PO) Take by mouth.   Yes [provider]  ibuprofen (ADVIL) 200 MG tablet Take 200 mg by mouth every 6 (six) hours as needed.   Yes [provider]  oseltamivir (TAMIFLU) 75 MG capsule Take 1 capsule (75 mg total) by mouth every 12 (twelve) hours. 03/19/22  Yes Kolsen Choe-Warren, Sadie Haber, NP  promethazine-dextromethorphan (PROMETHAZINE-DM) 6.25-15 MG/5ML syrup Take 5 mLs by mouth 4 (four) times daily as needed for cough. 03/19/22  Yes Nechuma Boven-Warren, Sadie Haber, NP  metoprolol succinate (TOPROL-XL) 25 MG 24 hr tablet Take 1 tablet (25 mg total) by mouth daily. 08/04/17 12/16/18  Creig Hines, NP    Family History Family History  Problem Relation Age of Onset   Depression Mother    Diabetes Mother        Type 2   Depression Father    COPD Maternal Grandmother    Bipolar disorder Maternal Grandmother    Cancer Maternal Grandfather    Cancer Paternal Grandmother     Social History Social History   Tobacco Use   Smoking status: Never   Smokeless tobacco: Never  Vaping Use   Vaping Use: Every day   Substances: Nicotine  Substance Use Topics   Alcohol use: Yes  Alcohol/week: 2.0 - 3.0 standard drinks of alcohol    Types: 2 - 3 Glasses of wine per week   Drug use: No     Allergies   Patient has no known allergies.   Review of Systems Review of Systems Per HPI  Physical Exam Triage Vital Signs ED Triage Vitals  Enc Vitals Group     BP 03/19/22 1430 123/80     Pulse Rate 03/19/22 1430 93     Resp 03/19/22 1430 16     Temp 03/19/22 1430 99.3 F (37.4 C)     Temp Source 03/19/22 1430 Oral     SpO2 03/19/22 1430 97 %     Weight --      Height --      Head Circumference --      Peak Flow --      Pain Score 03/19/22 1435 0     Pain Loc --      Pain Edu? --      Excl. in GC? --     No data found.  Updated Vital Signs BP 123/80 (BP Location: Right Arm)   Pulse 93   Temp 99.3 F (37.4 C) (Oral)   Resp 16   SpO2 97%   Visual Acuity Right Eye Distance:   Left Eye Distance:   Bilateral Distance:    Right Eye Near:   Left Eye Near:    Bilateral Near:     Physical Exam Vitals and nursing note reviewed.  Constitutional:      General: He is not in acute distress.    Appearance: Normal appearance.  HENT:     Head: Normocephalic.     Right Ear: Tympanic membrane, ear canal and external ear normal.     Left Ear: Tympanic membrane, ear canal and external ear normal.     Nose: Nose normal.     Right Turbinates: Enlarged and swollen.     Left Turbinates: Enlarged and swollen.     Right Sinus: No maxillary sinus tenderness or frontal sinus tenderness.     Left Sinus: No maxillary sinus tenderness or frontal sinus tenderness.     Mouth/Throat:     Mouth: Mucous membranes are moist.     Pharynx: Uvula midline. Posterior oropharyngeal erythema present. No pharyngeal swelling.  Eyes:     Extraocular Movements: Extraocular movements intact.     Conjunctiva/sclera: Conjunctivae normal.     Pupils: Pupils are equal, round, and reactive to light.  Cardiovascular:     Rate and Rhythm: Normal rate and regular rhythm.     Pulses: Normal pulses.     Heart sounds: Normal heart sounds.  Pulmonary:     Effort: No respiratory distress.     Breath sounds: Normal breath sounds. No stridor. No wheezing, rhonchi or rales.  Abdominal:     General: Bowel sounds are normal.     Palpations: Abdomen is soft.  Musculoskeletal:     Cervical back: Normal range of motion.  Lymphadenopathy:     Cervical: No cervical adenopathy.  Skin:    General: Skin is warm and dry.  Neurological:     General: No focal deficit present.     Mental Status: He is alert and oriented to person, place, and time.  Psychiatric:        Mood and Affect: Mood normal.        Behavior: Behavior normal.       UC Treatments / Results  Labs (all labs ordered are  listed, but only abnormal results are displayed) Labs Reviewed - No data to display  EKG   Radiology No results found.  Procedures Procedures (including critical care time)  Medications Ordered in UC Medications - No data to display  Initial Impression / Assessment and Plan / UC Course  I have reviewed the triage vital signs and the nursing notes.  Pertinent labs & imaging results that were available during my care of the patient were reviewed by me and considered in my medical decision making (see chart for details).   The patient is well-appearing, he is in no acute distress, vital signs are stable.  Suspect a viral illness, most likely consistent with influenza.  Unable to perform testing given the unavailability of resources.  Patient was also advised of the same, he reports "that is why came".  Advised patient that clinical diagnosis does not change treatment outcomes versus use of testing.  Patient was started on Tamiflu 75 mg for influenza and Promethazine DM to help with his cough and nausea and vomiting.  Supportive care recommendations were provided to the patient to include increasing fluids, allow for plenty of rest, and continuing use of Tylenol.  Discussed viral etiology with the patient with return precautions.  Patient verbalizes understanding.  All questions were answered.  Patient is stable for discharge.  Final Clinical Impressions(s) / UC Diagnoses   Final diagnoses:  Flu-like symptoms     Discharge Instructions      Take medication as prescribed. Increase fluids and allow for plenty of rest. Recommend using a humidifier in the bedroom at nighttime during sleep and sleeping elevated on pillows while cough symptoms persist. Recommend over-the-counter Tylenol or ibuprofen as needed for pain, fever, or general discomfort. As discussed, a viral illness can last from 10 to 14 days.  If your symptoms  worsen before that time, or extend beyond that time, please follow-up in this clinic or with your primary care physician for further evaluation. Follow-up as needed.    ED Prescriptions     Medication Sig Dispense Auth. Provider   promethazine-dextromethorphan (PROMETHAZINE-DM) 6.25-15 MG/5ML syrup Take 5 mLs by mouth 4 (four) times daily as needed for cough. 118 mL Hyun Reali-Warren, Sadie Haber, NP   oseltamivir (TAMIFLU) 75 MG capsule Take 1 capsule (75 mg total) by mouth every 12 (twelve) hours. 10 capsule Aundray Cartlidge-Warren, Sadie Haber, NP      PDMP not reviewed this encounter.   Abran Cantor, NP 03/19/22 1504

## 2022-03-19 NOTE — Discharge Instructions (Addendum)
Take medication as prescribed. Increase fluids and allow for plenty of rest. Recommend using a humidifier in the bedroom at nighttime during sleep and sleeping elevated on pillows while cough symptoms persist. Recommend over-the-counter Tylenol or ibuprofen as needed for pain, fever, or general discomfort. As discussed, a viral illness can last from 10 to 14 days.  If your symptoms worsen before that time, or extend beyond that time, please follow-up in this clinic or with your primary care physician for further evaluation. Follow-up as needed. 

## 2022-03-22 ENCOUNTER — Emergency Department (HOSPITAL_BASED_OUTPATIENT_CLINIC_OR_DEPARTMENT_OTHER)
Admission: EM | Admit: 2022-03-22 | Discharge: 2022-03-22 | Disposition: A | Discharge status: Home / Self Care | Payer: 59 | Attending: Emergency Medicine | Admitting: Emergency Medicine

## 2022-03-22 ENCOUNTER — Other Ambulatory Visit: Payer: Self-pay

## 2022-03-22 DIAGNOSIS — A080 Rotaviral enteritis: Secondary | ICD-10-CM | POA: Diagnosis not present

## 2022-03-22 DIAGNOSIS — R112 Nausea with vomiting, unspecified: Secondary | ICD-10-CM | POA: Diagnosis present

## 2022-03-22 LAB — GASTROINTESTINAL PANEL BY PCR, STOOL (REPLACES STOOL CULTURE)

## 2022-03-22 LAB — CBC WITH DIFFERENTIAL/PLATELET
Abs Immature Granulocytes: 0.01 10*3/uL (ref 0.00–0.07)
Basophils Absolute: 0 10*3/uL (ref 0.0–0.1)
Basophils Relative: 0 %
Eosinophils Absolute: 0.1 10*3/uL (ref 0.0–0.5)
Eosinophils Relative: 1 %
HCT: 39.3 % (ref 39.0–52.0)
Hemoglobin: 14.2 g/dL (ref 13.0–17.0)
Immature Granulocytes: 0 %
Lymphocytes Relative: 26 %
Lymphs Abs: 1.9 10*3/uL (ref 0.7–4.0)
MCH: 30.6 pg (ref 26.0–34.0)
MCHC: 36.1 g/dL — ABNORMAL HIGH (ref 30.0–36.0)
MCV: 84.7 fL (ref 80.0–100.0)
Monocytes Absolute: 1.1 10*3/uL — ABNORMAL HIGH (ref 0.1–1.0)
Monocytes Relative: 16 %
Neutro Abs: 4 10*3/uL (ref 1.7–7.7)
Neutrophils Relative %: 57 %
Platelets: 211 10*3/uL (ref 150–400)
RBC: 4.64 MIL/uL (ref 4.22–5.81)
RDW: 12.9 % (ref 11.5–15.5)
WBC: 7.1 10*3/uL (ref 4.0–10.5)
nRBC: 0 % (ref 0.0–0.2)

## 2022-03-22 LAB — COMPREHENSIVE METABOLIC PANEL
ALT: 21 U/L (ref 0–44)
AST: 22 U/L (ref 15–41)
Albumin: 4.7 g/dL (ref 3.5–5.0)
Alkaline Phosphatase: 62 U/L (ref 38–126)
Anion gap: 8 (ref 5–15)
BUN: 19 mg/dL (ref 6–20)
CO2: 25 mmol/L (ref 22–32)
Calcium: 9.4 mg/dL (ref 8.9–10.3)
Chloride: 104 mmol/L (ref 98–111)
Creatinine, Ser: 0.79 mg/dL (ref 0.61–1.24)
GFR, Estimated: >60 mL/min (ref >60)
Glucose, Bld: 90 mg/dL (ref 70–99)
Potassium: 3.7 mmol/L (ref 3.5–5.1)
Sodium: 137 mmol/L (ref 135–145)
Total Bilirubin: 1.7 mg/dL — ABNORMAL HIGH (ref 0.3–1.2)
Total Protein: 7.8 g/dL (ref 6.5–8.1)

## 2022-03-22 MED ORDER — DICYCLOMINE HCL 20 MG PO TABS: 20.0000 mg | ORAL_TABLET | Freq: Two times a day (BID) | ORAL | 0 refills | Status: DC

## 2022-03-22 MED ORDER — DICYCLOMINE HCL 10 MG/ML IM SOLN
20.0000 mg | Freq: Once | INTRAMUSCULAR | Status: AC
  Administered 2022-03-22: 20 mg via INTRAMUSCULAR
  Filled 2022-03-22: qty 2

## 2022-03-22 MED ORDER — ONDANSETRON HCL 4 MG/2ML IJ SOLN
4.0000 mg | Freq: Once | INTRAMUSCULAR | Status: AC
  Administered 2022-03-22: 4 mg via INTRAVENOUS
  Filled 2022-03-22: qty 2

## 2022-03-22 MED ORDER — ONDANSETRON 4 MG PO TBDP: 4.0000 mg | ORAL_TABLET | Freq: Three times a day (TID) | ORAL | 0 refills | Status: DC | PRN

## 2022-03-22 MED ORDER — PROMETHAZINE HCL 25 MG PO TABS: 25.0000 mg | ORAL_TABLET | Freq: Four times a day (QID) | ORAL | 0 refills | Status: DC | PRN

## 2022-03-22 MED ORDER — METOCLOPRAMIDE HCL 5 MG/ML IJ SOLN
10.0000 mg | Freq: Once | INTRAMUSCULAR | Status: AC | PRN
  Administered 2022-03-22: 10 mg via INTRAVENOUS
  Filled 2022-03-22: qty 2

## 2022-03-22 MED ORDER — SODIUM CHLORIDE 0.9 % IV BOLUS
2000.0000 mL | Freq: Once | INTRAVENOUS | Status: AC
  Administered 2022-03-22: 2000 mL via INTRAVENOUS

## 2022-03-22 NOTE — ED Provider Notes (Signed)
  Physical Exam  BP 137/87 (BP Location: Right Arm)   Pulse (!) 105   Temp 99.7 F (37.6 C) (Oral)   SpO2 97%   Physical Exam  Procedures  Procedures  ED Course / MDM    Received care of patient from Dr. Read Drivers. Please see his note for prior hx, hpysical and care.    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 Abdominal exam benign, do not suspect acute surgical emergency or diverticulitis.  History and exam consistent with likely viral gastroenteritis, although difficult to exclude other bacterial causes.   Labs obtained and personally interpreted by me show no leukocytosis, no anemia, normal electrolytes, normal renal function.  Received IV fluids, bentyl, reglan with improvement.  Given rx for zofran for nausea and bentyl for pain. Able to tolerate po.  Given rx for zofran for nausea during day, phenergan to use at night as needed nausea, bentyl for cramping pain.  GI pathogen panel is pending. Recommend continued supportive care at this time.        Alvira Monday, MD 03/22/22 [phone]

## 2022-03-22 NOTE — ED Triage Notes (Signed)
Pt. C/o N/V/D x 5days

## 2022-03-22 NOTE — ED Provider Notes (Signed)
[facility] Provider Note: Lowella Dell, MD, FACEP  CSN: [id number] MRN: [id number] ARRIVAL: 03/22/22 at 0615 ROOM: DB012/DB012   CHIEF COMPLAINT  N/V/D   HISTORY OF PRESENT ILLNESS  03/22/22 6:25 AM Damon Avila is a 21 y.o. male who developed URI symptoms (nasal congestion, cough, sneezing) on about 03/16/2022.  He had recently traveled to [place] and thinks he may have caught a virus on that trip.  He had been using saline nasal spray, Tylenol, ibuprofen, DayQuil and NyQuil for his symptoms.  On 03/18/2022 he developed nausea, vomiting, diarrhea and chills.  He was seen in urgent care on 03/19/2022 for the symptoms.  He was prescribed promethazine/dextromethorphan for his cough and was given a prescription for Tamiflu for possible flu infection although he was not tested for flu (he did take a home COVID/flu test the next day which was negative).  He did not take the Tamiflu.  He did take the promethazine/dextromethorphan and is now out.  Since the onset of his GI symptoms he has had recurrent bouts of nausea, vomiting, watery diarrhea and severe abdominal cramping.  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   Past Medical History:  Diagnosis Date   ADHD (attention deficit hyperactivity disorder), inattentive type    Allergy    Anxiety    a. Anxiety episode freshman year in high school.   Depression    Sinus tachycardia    a. 04/2017 Echo [facility]): nl LV/RV fxn, structurally nl heart; b. 05/2017 Event monitor (2 wks): Avg rate 82 bpm, Min HR 47, max HR 162. Predominantly sinus rhythm. Isolated SVE's/couplets (rare - <1%).    Past Surgical History:  Procedure Laterality Date   TONSILLECTOMY      Family History  Problem Relation Age of Onset   Depression Mother    Diabetes Mother        Type 2   Depression Father    COPD Maternal  Grandmother    Bipolar disorder Maternal Grandmother    Cancer Maternal Grandfather    Cancer Paternal Grandmother     Social History   Tobacco Use   Smoking status: Never   Smokeless tobacco: Never  Vaping Use   Vaping Use: Every day   Substances: Nicotine  Substance Use Topics   Alcohol use: Yes    Alcohol/week: 2.0 - 3.0 standard drinks of alcohol    Types: 2 - 3 Glasses of wine per week   Drug use: No    Prior to Admission medications   Medication Sig Start Date End Date Taking? Authorizing Provider  dicyclomine (BENTYL) 20 MG tablet Take 1 tablet (20 mg total) by mouth 2 (two) times daily. 03/22/22  Yes Alvira Monday, MD  ondansetron (ZOFRAN-ODT) 4 MG disintegrating tablet Take 1 tablet (4 mg total) by mouth every 8 (eight) hours as needed for nausea or vomiting. 03/22/22  Yes Alvira Monday, MD  promethazine (PHENERGAN) 25 MG tablet Take 1 tablet (25 mg total) by mouth every 6 (six) hours as needed for nausea or vomiting. 03/22/22  Yes Alvira Monday, MD  acetaminophen (TYLENOL) 500 MG tablet Take 500 mg by mouth every 6 (six) hours as needed.    [provider]  Doxylamine-DM (VICKS DAYQUIL/NYQUIL COUGH PO) Take by mouth.    [provider]  ibuprofen (ADVIL) 200 MG tablet Take 200 mg by  mouth every 6 (six) hours as needed.    [provider]  oseltamivir (TAMIFLU) 75 MG capsule Take 1 capsule (75 mg total) by mouth every 12 (twelve) hours. 03/19/22   Leath-Warren, Alda Lea, NP  promethazine-dextromethorphan (PROMETHAZINE-DM) 6.25-15 MG/5ML syrup Take 5 mLs by mouth 4 (four) times daily as needed for cough. 03/19/22   Leath-Warren, Alda Lea, NP  metoprolol succinate (TOPROL-XL) 25 MG 24 hr tablet Take 1 tablet (25 mg total) by mouth daily. 08/04/17 12/16/18  Theora Gianotti, NP    Allergies Patient has no known allergies.   REVIEW OF SYSTEMS  Negative except as noted here or in the History of Present Illness.   PHYSICAL  EXAMINATION  Initial Vital Signs Blood pressure 137/87, pulse (!) 105, temperature 99.7 F (37.6 C), temperature source Oral, SpO2 97 %.  Examination General: Well-developed, well-nourished male in no acute distress; appearance consistent with age of record HENT: normocephalic; atraumatic Eyes: Normal appearance Neck: supple Heart: regular rate and rhythm; tachycardia Lungs: clear to auscultation bilaterally Abdomen: soft; nondistended; nontender; bowel sounds present Extremities: No deformity; full range of motion Neurologic: Awake, alert and oriented; motor function intact in all extremities and symmetric; no facial droop Skin: Warm and dry Psychiatric: Normal mood and affect   RESULTS  Summary of this visit's results, reviewed and interpreted by myself:   EKG Interpretation  Date/Time:    Ventricular Rate:    PR Interval:    QRS Duration:   QT Interval:    QTC Calculation:   R Axis:     Text Interpretation:         Laboratory Studies: Results for orders placed or performed during the hospital encounter of 03/22/22 (from the past 24 hour(s))  CBC with Differential     Status: Abnormal   Collection Time: 03/22/22  6:40 AM  Result Value Ref Range   WBC 7.1 4.0 - 10.5 K/uL   RBC 4.64 4.22 - 5.81 MIL/uL   Hemoglobin 14.2 13.0 - 17.0 g/dL   HCT 39.3 39.0 - 52.0 %   MCV 84.7 80.0 - 100.0 fL   MCH 30.6 26.0 - 34.0 pg   MCHC 36.1 (H) 30.0 - 36.0 g/dL   RDW 12.9 11.5 - 15.5 %   Platelets 211 150 - 400 K/uL   nRBC 0.0 0.0 - 0.2 %   Neutrophils Relative % 57 %   Neutro Abs 4.0 1.7 - 7.7 K/uL   Lymphocytes Relative 26 %   Lymphs Abs 1.9 0.7 - 4.0 K/uL   Monocytes Relative 16 %   Monocytes Absolute 1.1 (H) 0.1 - 1.0 K/uL   Eosinophils Relative 1 %   Eosinophils Absolute 0.1 0.0 - 0.5 K/uL   Basophils Relative 0 %   Basophils Absolute 0.0 0.0 - 0.1 K/uL   Immature Granulocytes 0 %   Abs Immature Granulocytes 0.01 0.00 - 0.07 K/uL  Comprehensive metabolic panel      Status: Abnormal   Collection Time: 03/22/22  6:40 AM  Result Value Ref Range   Sodium 137 135 - 145 mmol/L   Potassium 3.7 3.5 - 5.1 mmol/L   Chloride 104 98 - 111 mmol/L   CO2 25 22 - 32 mmol/L   Glucose, Bld 90 70 - 99 mg/dL   BUN 19 6 - 20 mg/dL   Creatinine, Ser 0.79 0.61 - 1.24 mg/dL   Calcium 9.4 8.9 - 10.3 mg/dL   Total Protein 7.8 6.5 - 8.1 g/dL   Albumin 4.7 3.5 - 5.0  g/dL   AST 22 15 - 41 U/L   ALT 21 0 - 44 U/L   Alkaline Phosphatase 62 38 - 126 U/L   Total Bilirubin 1.7 (H) 0.3 - 1.2 mg/dL   GFR, Estimated >60 >60 mL/min   Anion gap 8 5 - 15  Gastrointestinal Panel by PCR , Stool     Status: Abnormal   Collection Time: 03/22/22  6:52 AM   Specimen: STOOL  Result Value Ref Range   Campylobacter species NOT DETECTED NOT DETECTED   Plesimonas shigelloides NOT DETECTED NOT DETECTED   Salmonella species NOT DETECTED NOT DETECTED   Yersinia enterocolitica NOT DETECTED NOT DETECTED   Vibrio species NOT DETECTED NOT DETECTED   Vibrio cholerae NOT DETECTED NOT DETECTED   Enteroaggregative E coli (EAEC) NOT DETECTED NOT DETECTED   Enteropathogenic E coli (EPEC) NOT DETECTED NOT DETECTED   Enterotoxigenic E coli (ETEC) NOT DETECTED NOT DETECTED   Shiga like toxin producing E coli (STEC) NOT DETECTED NOT DETECTED   Shigella/Enteroinvasive E coli (EIEC) NOT DETECTED NOT DETECTED   Cryptosporidium NOT DETECTED NOT DETECTED   Cyclospora cayetanensis NOT DETECTED NOT DETECTED   Entamoeba histolytica NOT DETECTED NOT DETECTED   Giardia lamblia NOT DETECTED NOT DETECTED   Adenovirus F40/41 NOT DETECTED NOT DETECTED   Astrovirus NOT DETECTED NOT DETECTED   Norovirus GI/GII NOT DETECTED NOT DETECTED   Rotavirus A DETECTED (A) NOT DETECTED   Sapovirus (I, II, IV, and V) NOT DETECTED NOT DETECTED   Imaging Studies: No results found.  ED COURSE and MDM  Nursing notes, initial and subsequent vitals signs, including pulse oximetry, reviewed and interpreted by myself.  Vitals:    03/22/22 0623 03/22/22 0745 03/22/22 0830  BP: 137/87 115/64 118/79  Pulse: (!) 105 81 85  Resp:  14 12  Temp: 99.7 F (37.6 C)  98.9 F (37.2 C)  TempSrc: Oral  Oral  SpO2: 97% 98% 100%   Medications  sodium chloride 0.9 % bolus 2,000 mL (0 mLs Intravenous Stopped 03/22/22 0847)  ondansetron (ZOFRAN) injection 4 mg (4 mg Intravenous Given 03/22/22 0635)  dicyclomine (BENTYL) injection 20 mg (20 mg Intramuscular Given 03/22/22 0636)  metoCLOPramide (REGLAN) injection 10 mg (10 mg Intravenous Given 03/22/22 0635)   6:29 AM IV fluids as ordered for rehydration.  Antiemetics and Bentyl ordered for symptomatic relief.    6:54 AM Stool sample sent for a GI panel as he did recently traveled out of the country.  7:00 AM Signed out to Dr. Billy Fischer.   PROCEDURES  Procedures   ED DIAGNOSES     ICD-10-CM   1. Viral gastroenteritis due to rotaviruses  A08.0          Gloriann Riede, MD 03/22/22 2251

## 2022-06-15 ENCOUNTER — Ambulatory Visit (HOSPITAL_BASED_OUTPATIENT_CLINIC_OR_DEPARTMENT_OTHER): Payer: No Typology Code available for payment source | Admitting: Family Medicine

## 2022-06-15 ENCOUNTER — Encounter (HOSPITAL_BASED_OUTPATIENT_CLINIC_OR_DEPARTMENT_OTHER): Payer: Self-pay | Admitting: Family Medicine

## 2022-06-15 VITALS — BP 115/73 | HR 72 | Temp 98.8°F | Ht 67.0 in | Wt 155.6 lb

## 2022-06-15 DIAGNOSIS — Z1283 Encounter for screening for malignant neoplasm of skin: Secondary | ICD-10-CM

## 2022-06-15 DIAGNOSIS — Z0000 Encounter for general adult medical examination without abnormal findings: Secondary | ICD-10-CM

## 2022-06-15 DIAGNOSIS — F902 Attention-deficit hyperactivity disorder, combined type: Secondary | ICD-10-CM

## 2022-06-15 MED ORDER — LISDEXAMFETAMINE DIMESYLATE 10 MG PO CAPS: 10.0000 mg | ORAL_CAPSULE | Freq: Every day | ORAL | 0 refills | Status: DC

## 2022-06-15 NOTE — Assessment & Plan Note (Addendum)
Routine HCM labs ordered. HCM reviewed/discussed. Anticipatory guidance regarding healthy weight, lifestyle and choices given. Recommend healthy diet.  Recommend approximately 150 minutes/week of moderate intensity exercise Recommend regular dental and vision exams Always use seatbelt/lap and shoulder restraints Recommend using smoke alarms and checking batteries at least twice a year Recommend using sunscreen when outside Discussed tetanus immunization recommendations, patient does feel this is UTD Recalls receiving HPV vaccine in the past

## 2022-06-15 NOTE — Progress Notes (Signed)
Subjective:    CC: Annual Physical Exam  HPI:  Damon Avila is a 22 y.o. presenting for annual physical  I reviewed the past medical history, family history, social history, surgical history, and allergies today and no changes were needed.  Please see the problem list section below in epic for further details.  Past Medical History: Past Medical History:  Diagnosis Date   ADHD (attention deficit hyperactivity disorder), inattentive type    Allergy    Anxiety    a. Anxiety episode freshman year in high school.   Depression    Sinus tachycardia    a. 04/2017 Echo [facility]): nl LV/RV fxn, structurally nl heart; b. 05/2017 Event monitor (2 wks): Avg rate 82 bpm, Min HR 47, max HR 162. Predominantly sinus rhythm. Isolated SVE's/couplets (rare - <1%).   Past Surgical History: Past Surgical History:  Procedure Laterality Date   TONSILLECTOMY     Social History: Social History   Socioeconomic History   Marital status: Significant Other    Spouse name: Not on file   Number of children: Not on file   Years of education: Not on file   Highest education level: Not on file  Occupational History   Not on file  Tobacco Use   Smoking status: Never   Smokeless tobacco: Never  Vaping Use   Vaping Use: Every day   Substances: Nicotine  Substance and Sexual Activity   Alcohol use: Yes    Alcohol/week: 2.0 - 3.0 standard drinks of alcohol    Types: 2 - 3 Glasses of wine per week   Drug use: No   Sexual activity: Yes  Other Topics Concern   Not on file  Social History Narrative   Not on file   Social Determinants of Health   Financial Resource Strain: Not on file  Food Insecurity: Not on file  Transportation Needs: Not on file  Physical Activity: Not on file  Stress: Not on file  Social Connections: Not on file   Family History: Family History  Problem Relation Age of Onset   Depression Mother    Diabetes Mother        Type 2   Depression Father    COPD Maternal  Grandmother    Bipolar disorder Maternal Grandmother    Cancer Maternal Grandfather    Cancer Paternal Grandmother    Allergies: No Known Allergies Medications: See med rec.  Review of Systems: No headache, visual changes, nausea, vomiting, diarrhea, constipation, dizziness, abdominal pain, skin rash, fevers, chills, night sweats, swollen lymph nodes, weight loss, chest pain, body aches, joint swelling, muscle aches, shortness of breath, mood changes, visual or auditory hallucinations.  Objective:    BP 115/73   Pulse 72   Temp 98.8 F (37.1 C) (Oral)   Ht 5\' 7"  (1.702 m)   Wt 155 lb 9.6 oz (70.6 kg)   SpO2 100%   BMI 24.37 kg/m   General: Well Developed, well nourished, and in no acute distress. Neuro: Alert and oriented x3, extra-ocular muscles intact, sensation grossly intact. Cranial nerves II through XII are intact, motor, sensory, and coordinative functions are all intact. HEENT: Normocephalic, atraumatic, pupils equal round reactive to light, neck supple, no masses, no lymphadenopathy, thyroid nonpalpable. Oropharynx, nasopharynx, external ear canals are unremarkable. Skin: Warm and dry, no rashes noted. Cardiac: Regular rate and rhythm, no murmurs rubs or gallops. Respiratory: Clear to auscultation bilaterally. Not using accessory muscles, speaking in full sentences. Abdominal: Soft, nontender, nondistended, positive bowel sounds, no  masses, no organomegaly. Musculoskeletal: Shoulder, elbow, wrist, hip, knee, ankle stable, and with full range of motion.  Impression and Recommendations:    Wellness examination Routine HCM labs ordered. HCM reviewed/discussed. Anticipatory guidance regarding healthy weight, lifestyle and choices given. Recommend healthy diet.  Recommend approximately 150 minutes/week of moderate intensity exercise Recommend regular dental and vision exams Always use seatbelt/lap and shoulder restraints Recommend using smoke alarms and checking batteries  at least twice a year Recommend using sunscreen when outside Discussed tetanus immunization recommendations, patient does feel this is UTD Recalls receiving HPV vaccine in the past  Attention deficit hyperactivity disorder (ADHD), combined type Patient reports history of ADHD for which he has utilized medication in the past.  Reports that prior pharmacotherapy has included Adderall and Ritalin.  He indicates that he did not tolerate Ritalin well.  He was managed on Adderall, however had notable side effects pertaining to appetite and sleep issues.  He does feel that symptoms have been more impactful in recent years, last time utilizing pharmacotherapy was about 4 to 5 years ago. We discussed general considerations today.  Given prior adequate response with Adderall and Ritalin, would consider utilizing an alternative stimulant medication.  Patient amenable, we will proceed with use of Vyvanse.  If this is cost prohibitive or not on formulary, consider utilizing alternative stimulant or possibly nonstimulant medication. Will plan for follow-up in about 3 to 4 weeks to assess progress.  If tolerating medication, however still having symptoms, can proceed with dose titration to 20 mg.  If having notable side effects without observed improvement, likely transition to alternative stimulant or proceeding with nonstimulant option  Return in about 3 weeks (around 07/06/2022) for ADHD, med check.   ___________________________________________ Kamalei Roeder de [place], MD, ABFM, [facility] and [place]

## 2022-06-15 NOTE — Assessment & Plan Note (Signed)
Patient reports history of ADHD for which he has utilized medication in the past.  Reports that prior pharmacotherapy has included Adderall and Ritalin.  He indicates that he did not tolerate Ritalin well.  He was managed on Adderall, however had notable side effects pertaining to appetite and sleep issues.  He does feel that symptoms have been more impactful in recent years, last time utilizing pharmacotherapy was about 4 to 5 years ago. We discussed general considerations today.  Given prior adequate response with Adderall and Ritalin, would consider utilizing an alternative stimulant medication.  Patient amenable, we will proceed with use of Vyvanse.  If this is cost prohibitive or not on formulary, consider utilizing alternative stimulant or possibly nonstimulant medication. Will plan for follow-up in about 3 to 4 weeks to assess progress.  If tolerating medication, however still having symptoms, can proceed with dose titration to 20 mg.  If having notable side effects without observed improvement, likely transition to alternative stimulant or proceeding with nonstimulant option

## 2022-06-17 ENCOUNTER — Other Ambulatory Visit (HOSPITAL_BASED_OUTPATIENT_CLINIC_OR_DEPARTMENT_OTHER): Payer: Self-pay

## 2022-06-20 ENCOUNTER — Other Ambulatory Visit (HOSPITAL_BASED_OUTPATIENT_CLINIC_OR_DEPARTMENT_OTHER): Payer: Self-pay

## 2022-06-20 ENCOUNTER — Telehealth (HOSPITAL_BASED_OUTPATIENT_CLINIC_OR_DEPARTMENT_OTHER): Payer: Self-pay | Admitting: Family Medicine

## 2022-06-20 ENCOUNTER — Other Ambulatory Visit (HOSPITAL_BASED_OUTPATIENT_CLINIC_OR_DEPARTMENT_OTHER): Payer: No Typology Code available for payment source

## 2022-06-20 DIAGNOSIS — F902 Attention-deficit hyperactivity disorder, combined type: Secondary | ICD-10-CM

## 2022-06-20 DIAGNOSIS — Z0000 Encounter for general adult medical examination without abnormal findings: Secondary | ICD-10-CM

## 2022-06-20 MED ORDER — LISDEXAMFETAMINE DIMESYLATE 10 MG PO CAPS
10.0000 mg | ORAL_CAPSULE | Freq: Every day | ORAL | 0 refills | Status: DC
  Filled 2022-06-20: qty 30, 30d supply, fill #0

## 2022-06-20 NOTE — Telephone Encounter (Signed)
Please send Vyvanse to the pharmacy downstairs---the original pharmacy is Out .  Thanks

## 2022-06-21 LAB — CBC WITH DIFFERENTIAL/PLATELET
Basophils Absolute: 0 10*3/uL (ref 0.0–0.2)
Basos: 0 %
EOS (ABSOLUTE): 0.1 10*3/uL (ref 0.0–0.4)
Eos: 2 %
Hematocrit: 41.4 % (ref 37.5–51.0)
Hemoglobin: 14 g/dL (ref 13.0–17.7)
Immature Grans (Abs): 0 10*3/uL (ref 0.0–0.1)
Immature Granulocytes: 0 %
Lymphocytes Absolute: 2 10*3/uL (ref 0.7–3.1)
Lymphs: 33 %
MCH: 29.6 pg (ref 26.6–33.0)
MCHC: 33.8 g/dL (ref 31.5–35.7)
MCV: 88 fL (ref 79–97)
Monocytes Absolute: 0.6 10*3/uL (ref 0.1–0.9)
Monocytes: 9 %
Neutrophils Absolute: 3.4 10*3/uL (ref 1.4–7.0)
Neutrophils: 56 %
Platelets: 224 10*3/uL (ref 150–450)
RBC: 4.73 x10E6/uL (ref 4.14–5.80)
RDW: 12.3 % (ref 11.6–15.4)
WBC: 6.1 10*3/uL (ref 3.4–10.8)

## 2022-06-21 LAB — LIPID PANEL
Chol/HDL Ratio: 4.6 ratio (ref 0.0–5.0)
Cholesterol, Total: 176 mg/dL (ref 100–199)
HDL: 38 mg/dL — ABNORMAL LOW (ref >39)
LDL Chol Calc (NIH): 118 mg/dL — ABNORMAL HIGH (ref 0–99)
Triglycerides: 107 mg/dL (ref 0–149)
VLDL Cholesterol Cal: 20 mg/dL (ref 5–40)

## 2022-06-21 LAB — COMPREHENSIVE METABOLIC PANEL
ALT: 11 IU/L (ref 0–44)
AST: 13 IU/L (ref 0–40)
Albumin/Globulin Ratio: 1.5 (ref 1.2–2.2)
Albumin: 4.3 g/dL (ref 4.3–5.2)
Alkaline Phosphatase: 98 IU/L (ref 44–121)
BUN/Creatinine Ratio: 11 (ref 9–20)
BUN: 11 mg/dL (ref 6–20)
Bilirubin Total: 0.3 mg/dL (ref 0.0–1.2)
CO2: 20 mmol/L (ref 20–29)
Calcium: 9.7 mg/dL (ref 8.7–10.2)
Chloride: 104 mmol/L (ref 96–106)
Creatinine, Ser: 0.99 mg/dL (ref 0.76–1.27)
Globulin, Total: 2.8 g/dL (ref 1.5–4.5)
Glucose: 85 mg/dL (ref 70–99)
Potassium: 4.1 mmol/L (ref 3.5–5.2)
Sodium: 140 mmol/L (ref 134–144)
Total Protein: 7.1 g/dL (ref 6.0–8.5)
eGFR: 110 mL/min/{1.73_m2} (ref >59)

## 2022-06-21 LAB — TSH RFX ON ABNORMAL TO FREE T4: TSH: 2.55 u[IU]/mL (ref 0.450–4.500)

## 2022-07-08 ENCOUNTER — Encounter (HOSPITAL_BASED_OUTPATIENT_CLINIC_OR_DEPARTMENT_OTHER): Payer: Self-pay | Admitting: Family Medicine

## 2022-07-08 ENCOUNTER — Ambulatory Visit (HOSPITAL_BASED_OUTPATIENT_CLINIC_OR_DEPARTMENT_OTHER): Payer: No Typology Code available for payment source | Admitting: Family Medicine

## 2022-07-08 ENCOUNTER — Other Ambulatory Visit (HOSPITAL_BASED_OUTPATIENT_CLINIC_OR_DEPARTMENT_OTHER): Payer: Self-pay

## 2022-07-08 DIAGNOSIS — F902 Attention-deficit hyperactivity disorder, combined type: Secondary | ICD-10-CM

## 2022-07-08 MED ORDER — LISDEXAMFETAMINE DIMESYLATE 20 MG PO CAPS
20.0000 mg | ORAL_CAPSULE | Freq: Every day | ORAL | 0 refills | Status: DC
  Filled 2022-07-25: qty 30, 30d supply, fill #0

## 2022-07-08 NOTE — Progress Notes (Signed)
   Established Patient Office Visit  Subjective   Patient ID: Damon Avila, male    DOB: 06/05/2000  Age: 22 y.o. MRN: [id number]  Chief Complaint  Patient presents with   Medication Management    Pt here for f/u on med check    Currently taking Vyvanse 10mg  daily as prescribed  Reports improvement in ADHD symptoms but thinks an increased dose could help him stop jumping between tasks.  Denies nausea, heart palpitations, decrease in appetite, insomnia, chest pain, feelings of anxiety. Appetite has been consistent, denies decreases.  Take it in the morning, and lasts all day  Would like to proceed with increase 20mg - but is not sure how he will react to side effects with an increased dose due to his intolerance to Ritalin with Adderall.     Review of Systems  Constitutional:  Negative for diaphoresis and weight loss.  Respiratory:  Negative for shortness of breath.   Cardiovascular:  Negative for chest pain and palpitations.  Gastrointestinal:  Negative for nausea and vomiting.  Neurological:  Negative for weakness.  Psychiatric/Behavioral:  Negative for substance abuse. The patient is not nervous/anxious and does not have insomnia.     Objective:    BP 111/71 (BP Location: Left Arm, Patient Position: Sitting, Cuff Size: Normal)   Pulse 77   Ht 5\' 7"  (1.702 m)   Wt 153 lb (69.4 kg)   SpO2 99%   BMI 23.96 kg/m  BP Readings from Last 3 Encounters:  07/08/22 111/71  06/15/22 115/73  03/19/22 123/80     Physical Exam Constitutional:      Appearance: Normal appearance.  Cardiovascular:     Rate and Rhythm: Normal rate and regular rhythm.     Pulses: Normal pulses.     Heart sounds: Normal heart sounds.  Pulmonary:     Effort: Pulmonary effort is normal.     Breath sounds: Normal breath sounds.  Neurological:     Mental Status: He is alert.  Psychiatric:        Mood and Affect: Mood normal.        Behavior: Behavior normal.        Thought Content: Thought content  normal.        Judgment: Judgment normal.    Assessment & Plan:  1. Attention deficit hyperactivity disorder (ADHD), combined type Patient currently taking 10mg  Vyvanse as prescribed. He is tolerating medication well and would like to proceed with dose titration to 20mg  daily. Will send 20mg  Vyvanse to pharmacy to be filled starting 07/21/2022. PDMP reviewed, no red flags present. Advised if he starts to experience notable side effects without improvement, advised we can decrease dose back to 10mg  or change medication altogether.  - Lisdexamfetamine (Vyvanse) 20mg  capsule sent; Future starting 07/21/2022     Alyson Reedy, FNP

## 2022-07-25 ENCOUNTER — Other Ambulatory Visit (HOSPITAL_BASED_OUTPATIENT_CLINIC_OR_DEPARTMENT_OTHER): Payer: Self-pay

## 2022-07-27 ENCOUNTER — Other Ambulatory Visit (HOSPITAL_BASED_OUTPATIENT_CLINIC_OR_DEPARTMENT_OTHER): Payer: Self-pay

## 2022-07-28 ENCOUNTER — Other Ambulatory Visit: Payer: Self-pay

## 2022-08-17 ENCOUNTER — Ambulatory Visit (HOSPITAL_BASED_OUTPATIENT_CLINIC_OR_DEPARTMENT_OTHER): Payer: No Typology Code available for payment source | Admitting: Family Medicine

## 2022-08-19 ENCOUNTER — Ambulatory Visit (HOSPITAL_BASED_OUTPATIENT_CLINIC_OR_DEPARTMENT_OTHER): Payer: No Typology Code available for payment source | Admitting: Family Medicine

## 2022-09-21 ENCOUNTER — Ambulatory Visit (HOSPITAL_BASED_OUTPATIENT_CLINIC_OR_DEPARTMENT_OTHER): Payer: No Typology Code available for payment source | Admitting: Family Medicine

## 2022-09-22 ENCOUNTER — Ambulatory Visit (HOSPITAL_BASED_OUTPATIENT_CLINIC_OR_DEPARTMENT_OTHER): Payer: No Typology Code available for payment source | Admitting: Family Medicine

## 2022-09-22 ENCOUNTER — Encounter (HOSPITAL_BASED_OUTPATIENT_CLINIC_OR_DEPARTMENT_OTHER): Payer: Self-pay | Admitting: Family Medicine

## 2022-09-22 ENCOUNTER — Other Ambulatory Visit: Payer: Self-pay

## 2022-09-22 ENCOUNTER — Other Ambulatory Visit (HOSPITAL_BASED_OUTPATIENT_CLINIC_OR_DEPARTMENT_OTHER): Payer: Self-pay

## 2022-09-22 VITALS — BP 127/80 | HR 92 | Ht 67.0 in | Wt 159.6 lb

## 2022-09-22 DIAGNOSIS — F32A Depression, unspecified: Secondary | ICD-10-CM | POA: Diagnosis not present

## 2022-09-22 DIAGNOSIS — F322 Major depressive disorder, single episode, severe without psychotic features: Secondary | ICD-10-CM

## 2022-09-22 DIAGNOSIS — F39 Unspecified mood [affective] disorder: Secondary | ICD-10-CM

## 2022-09-22 DIAGNOSIS — F419 Anxiety disorder, unspecified: Secondary | ICD-10-CM

## 2022-09-22 DIAGNOSIS — F902 Attention-deficit hyperactivity disorder, combined type: Secondary | ICD-10-CM | POA: Diagnosis not present

## 2022-09-22 MED ORDER — LISDEXAMFETAMINE DIMESYLATE 20 MG PO CAPS
20.0000 mg | ORAL_CAPSULE | Freq: Every day | ORAL | 0 refills | Status: DC
  Filled 2022-09-22: qty 30, 30d supply, fill #0

## 2022-09-22 NOTE — Progress Notes (Signed)
Established Patient Office Visit  Subjective   Patient ID: CORNEAL ELICK, male    DOB: Jan 12, 2001  Age: 22 y.o. MRN: [id number]  History of Present Illness:  Rhon Bloome is a 22 year-old male patient who presents today for concerns about his mood. Patient reports he has a history of anxiety, depression, and ADHD. He reports freshman year of high school (about 10 years ago) is when he first noticed his anxiety. Has not noticed any panic attacks since that time. He reports he was diagnosed with a single episode of depression due to family stressors. He has been on an SSRI in the past and feels that this did not help improve his mood. He was able to come off meds and was just doing counseling and felt this was very helpful. However, with changes jobs and health insurance, he did lose coverage for counseling and has not been for the past couple of months (since about January). He reports "I feel like I have anxiety."   He reports noticing that he is more irritable, with "a short fuse," and experiences frequent anxiety with certain situations. He reports in the past month, experiencing these "episodes" a couple times each week. He reports experiencing more fights with his partner and his mood causing "more problems at home." He feels like he is always easily irritable and feels like these can last 1-3 days. He reports that he "had a moment last week" where he almost "called off current relationship." He is concerned about bipolar disorder- he does have a long family history of bipolar disorder- including his mother, father, maternal aunt, and maternal grandmother.   He reports that he goes from being very focused- cleaning the entire house in a day- to other times where he is just physically and mentally exhausted and wants to lie around and watch tv all day.   Denies excessive spending, risky sexual behavior, and does not feel like he experiences any manic episodes.   ADHD- doing well on Vyvanse  20mg  He reports that he missed his appt last month for his ADHD f/u and has not been taking his Vyvanse for the past month. He reports a decrease in his appetite but reports that his appetite is not different being on Vyvanse.      09/22/2022    1:50 PM 07/08/2022    9:27 AM  Depression screen PHQ 2/9  Decreased Interest 1 0  Down, Depressed, Hopeless 0 0  PHQ - 2 Score 1 0  Altered sleeping 1   Tired, decreased energy 1   Change in appetite 2   Feeling bad or failure about yourself  0   Trouble concentrating 0   Moving slowly or fidgety/restless 0   Suicidal thoughts 0   PHQ-9 Score 5   Difficult doing work/chores Not difficult at all        09/22/2022    1:42 PM 07/08/2022    9:28 AM  GAD 7 : Generalized Anxiety Score  Nervous, Anxious, on Edge 2 0  Control/stop worrying 0 0  Worry too much - different things 2 0  Trouble relaxing 1 0  Restless 1 0  Easily annoyed or irritable 2 0  Afraid - awful might happen 0 0  Total GAD 7 Score 8 0  Anxiety Difficulty Not difficult at all Not difficult at all    (MDQ) Mood Disorder Questionnaire 1.  Has there ever been a period of time when you were not your usual self and: (not  recorded) You felt so good or so hyper that other people thought you were not your normal self or you were so hyper that you got into trouble?: No You were so irritable that you shouted at people or started fights or arguments?: Yes You felt much more self-confident than usual?: No You got much less sleep than usual and found you didn't really miss it?: Yes You were much more talkative or spoke faster than usual?: No Thoughts raced through your head or you couldn't slow your mind down?: Yes You were so easily distracted by things around you that you had trouble : Yes You had much more energy than usual?: Yes You were much more active or did many more things than usual?: Yes You were much more social or outgoing than usual, for example, you : No You were much  more interested in sex than usual?: No You did things that were unusual for you or that other people might have : No Spending money got you or your family in trouble?: No 2. If you checked YES to more than one of the above, have several of these ever : Yes 3. How much of a problem did any of these cause you -- like being able to work; : Serious problem 4. Have any of your blood relatives had manic-depressive illness or bipolar disorder?: Yes 5. Has a health professional ever told you that you have manic-depressive illness : No (MDQ) Mood Disorder Questionnaire Total Score: (not recorded) 6  Review of Systems  Respiratory:  Negative for cough and shortness of breath.   Cardiovascular:  Negative for chest pain and palpitations.  Neurological:  Negative for sensory change, speech change and headaches.  Psychiatric/Behavioral:  Positive for depression. Negative for hallucinations, substance abuse and suicidal ideas. The patient is nervous/anxious.      Objective:    BP 127/80   Pulse 92   Ht 5\' 7"  (1.702 m)   Wt 159 lb 9.6 oz (72.4 kg)   SpO2 99%   BMI 25.00 kg/m  BP Readings from Last 3 Encounters:  09/22/22 127/80  07/08/22 111/71  06/15/22 115/73     Physical Exam Constitutional:      Appearance: Normal appearance.  Cardiovascular:     Rate and Rhythm: Normal rate and regular rhythm.     Pulses: Normal pulses.     Heart sounds: Normal heart sounds.  Pulmonary:     Effort: Pulmonary effort is normal.     Breath sounds: Normal breath sounds.  Neurological:     Mental Status: He is alert and oriented to person, place, and time.  Psychiatric:        Attention and Perception: Attention normal.        Mood and Affect: Mood and affect normal.        Speech: Speech normal.        Behavior: Behavior normal. Behavior is cooperative.        Thought Content: Thought content normal. Thought content does not include homicidal or suicidal plan.        Cognition and Memory: Cognition  normal.        Judgment: Judgment normal.      Assessment & Plan:  1. Anxiety and depression Chronic. Patient has a history of anxiety and major depressive disorder and is currently not taking any medications. He reports that he was on an SSRI medication in the past, did not notice much benefit with regimen, and was able to wean off medication.  Patient reports that cognitive behavioral therapy was beneficial to him. However, due to his insurance, counseling services are not covered. He plans to return to counseling in the near future.   2. Mood disorder [facility]) Patient has new concerns about the possibility of him having a mood disorder. He reports strong family history of bipolar disorder. He does have a history of SSRIs not adequately treating his symptoms. Mood disorder questionnaire (MDQ) completed (see HPI). Review of scoring, patient has 75% positive activation and 50% negative activation. Discussed referral to psychiatrist for formal diagnosis. Patient is agreeable to plan of care. Will follow-up in 4 weeks to make sure patient has an appointment set up.  - Ambulatory referral to Psychiatry  3. Attention deficit hyperactivity disorder (ADHD), combined type Chronic. Patient currently taking Vyvanse 20mg  as prescribed with no adverse side effects. He reports his inattention is well controlled on this dose and would like to continue current regimen. PDMP reviewed, no red flags. Provided refill today. Plan to follow-up in 4 weeks for ADHD.    Return in about 4 weeks (around 10/20/2022) for Mood f/u with me (can be virtual).    Alyson Reedy, FNP

## 2022-09-23 DIAGNOSIS — F39 Unspecified mood [affective] disorder: Secondary | ICD-10-CM | POA: Insufficient documentation

## 2022-10-07 ENCOUNTER — Encounter (HOSPITAL_BASED_OUTPATIENT_CLINIC_OR_DEPARTMENT_OTHER): Payer: Self-pay | Admitting: Family Medicine

## 2022-10-19 NOTE — Progress Notes (Signed)
   Virtual Video Visit for Established Patient   Subjective   Patient ID: Damon Avila, male    DOB: [birth year]  Age: 22 y.o. MRN: [id number]  I connected with Damon Avila on 10/21/2022 by video and verified that I am speaking with the correct person using two identifiers.   I discussed the limitations, risks, security and privacy concerns of performing an evaluation and management service by telephone, including the higher likelihood of inaccurate diagnosis and treatment, and the availability of in person appointments.  We also discussed the likely need of an additional face to face encounter for complete and high quality delivery of care.  I also discussed with the patient that there may be a patient responsible charge related to this service. The patient expressed understanding and wishes to proceed.   Provider location is in medical facility. Patient location is at his home. People involved in care of the patient during this telehealth encounter were myself, my nurse/medical assistant, and my front office/scheduling team member.  Patient has been taking 20mg  Vyvanse daily.   He does feel that medication has helped with controlling symptoms and reports no chest pain, shortness of breath, difficulty sleeping, tachycardia, palpitations, or increase in anxiety.    Review of Systems: No fevers, chills, night sweats, weight loss, chest pain, or shortness of breath.   He reports that he does psychiatry appt scheduled for 11/03/2022.   Objective:    Ht 5\' 7"  (1.702 m)   Wt 155 lb (70.3 kg)   BMI 24.28 kg/m  BP Readings from Last 3 Encounters:  09/22/22 127/80  07/08/22 111/71  06/15/22 115/73    General: speaking full sentences, no audible heavy breathing. Sounds alert and appropriately interactive.   Assessment & Plan:   1. Attention deficit hyperactivity disorder (ADHD), combined type Patient continues with 20mg  Vyvanse daily.  He reports his ADHD is well controlled and would like  to remain on this dose. He reports that he is still hesitant about increasing dose to 30mg  and wants to stay at 20mg  currently. Patient will reach out if he would like to increase dose sooner than his scheduled follow-up appointment. He has not had any new issues related to sleep concerns, chest pain, palpitations, no appetite concerns.  Overall, he feels that he has been doing well. PDMP reviewed, no red flags. Refill provided for today.  Given control of symptoms without adverse side effects, can continue with current dosing with 20mg  Vyvanse daily. Advised patient to schedule 3 month follow-up for ADHD management.      Return in about 3 months (around 01/21/2023) for ADHD medication .    Alyson Reedy, FNP

## 2022-10-21 ENCOUNTER — Other Ambulatory Visit: Payer: Self-pay

## 2022-10-21 ENCOUNTER — Other Ambulatory Visit (HOSPITAL_BASED_OUTPATIENT_CLINIC_OR_DEPARTMENT_OTHER): Payer: Self-pay

## 2022-10-21 ENCOUNTER — Encounter (HOSPITAL_BASED_OUTPATIENT_CLINIC_OR_DEPARTMENT_OTHER): Payer: Self-pay | Admitting: Family Medicine

## 2022-10-21 ENCOUNTER — Telehealth (HOSPITAL_BASED_OUTPATIENT_CLINIC_OR_DEPARTMENT_OTHER): Payer: Managed Care, Other (non HMO) | Admitting: Family Medicine

## 2022-10-21 VITALS — Ht 67.0 in | Wt 155.0 lb

## 2022-10-21 DIAGNOSIS — F902 Attention-deficit hyperactivity disorder, combined type: Secondary | ICD-10-CM | POA: Diagnosis not present

## 2022-10-21 MED ORDER — LISDEXAMFETAMINE DIMESYLATE 20 MG PO CAPS
20.0000 mg | ORAL_CAPSULE | Freq: Every day | ORAL | 0 refills | Status: DC
  Filled 2022-10-21: qty 30, 30d supply, fill #0

## 2022-10-21 MED ORDER — LISDEXAMFETAMINE DIMESYLATE 20 MG PO CAPS
20.0000 mg | ORAL_CAPSULE | Freq: Every day | ORAL | 0 refills | Status: DC
  Filled 2023-01-25: qty 30, 30d supply, fill #0

## 2022-10-21 MED ORDER — LISDEXAMFETAMINE DIMESYLATE 20 MG PO CAPS
20.0000 mg | ORAL_CAPSULE | Freq: Every day | ORAL | 0 refills | Status: DC
  Filled 2022-10-21 – 2022-11-03 (×2): qty 30, 30d supply, fill #0

## 2022-10-21 MED ORDER — LISDEXAMFETAMINE DIMESYLATE 20 MG PO CAPS
20.0000 mg | ORAL_CAPSULE | Freq: Every day | ORAL | 0 refills | Status: DC
  Filled 2022-12-01: qty 30, 30d supply, fill #0

## 2022-10-31 ENCOUNTER — Other Ambulatory Visit (HOSPITAL_BASED_OUTPATIENT_CLINIC_OR_DEPARTMENT_OTHER): Payer: Self-pay

## 2022-11-03 ENCOUNTER — Other Ambulatory Visit (HOSPITAL_BASED_OUTPATIENT_CLINIC_OR_DEPARTMENT_OTHER): Payer: Self-pay

## 2022-11-03 ENCOUNTER — Encounter: Payer: Self-pay | Admitting: Behavioral Health

## 2022-11-03 ENCOUNTER — Ambulatory Visit (INDEPENDENT_AMBULATORY_CARE_PROVIDER_SITE_OTHER): Payer: 59 | Admitting: Behavioral Health

## 2022-11-03 ENCOUNTER — Other Ambulatory Visit: Payer: Self-pay

## 2022-11-03 VITALS — BP 129/81 | HR 91 | Ht 68.0 in | Wt 155.0 lb

## 2022-11-03 DIAGNOSIS — F331 Major depressive disorder, recurrent, moderate: Secondary | ICD-10-CM | POA: Diagnosis not present

## 2022-11-03 DIAGNOSIS — F39 Unspecified mood [affective] disorder: Secondary | ICD-10-CM | POA: Diagnosis not present

## 2022-11-03 DIAGNOSIS — F411 Generalized anxiety disorder: Secondary | ICD-10-CM

## 2022-11-03 MED ORDER — VILAZODONE HCL 20 MG PO TABS
ORAL_TABLET | ORAL | 1 refills | Status: DC
  Filled 2022-11-03: qty 30, 30d supply, fill #0
  Filled 2022-11-30: qty 30, 30d supply, fill #1

## 2022-11-03 MED ORDER — LAMOTRIGINE 25 MG PO TABS
ORAL_TABLET | ORAL | 1 refills | Status: DC
  Filled 2022-11-03: qty 60, 30d supply, fill #0

## 2022-11-03 NOTE — Progress Notes (Addendum)
[facility] MD/PA/NP Initial Note  11/03/2022 5:12 PM Damon Avila  MRN:  [id number]  Chief Complaint:  Chief Complaint   Anxiety; Depression; Establish Care; Patient Education; Manic Behavior     HPI:   "Damon Avila 22 year old male presents to this office for initial visit and to establish care.  Collateral information should be considered reliable.  Patient is very pleasant, cooperative, and articulate.  He is currently working as a Radiation protection practitioner for multiple agencies.  Says he is here today out of concerns that symptoms for bipolar may be presenting.  Says that he has a strong family history of bipolar disorder to include both of his parents, and 1 grandparent on maternal side.  Says that over the last 1.5 years his family and friends have been more concerned with his frequent mood swings.  Says that his highs and lows cycle about every 2 weeks.  He endorses frequent irritability, racing thoughts, increased self-confidence, lack of concentration, increased energy, not missing sleep, and more activity.  Says that when he has "hyperactivity" spells he becomes very active and can clean his whole house.  His MDQ was positive with 8 of 14 criteria on marked yes.  He indicates as a serious problem.  His PHQ-9 was score of 9.  Says that he is open to trying new medication that may help with his moods but he would like to avoid medication that causes sexual side effects.  He is previously tried multiple stimulants for ADHD as a young child and in 2020 had a trial of Zoloft for anxiety and depression.  He denies any current mania today, no psychosis, no history of auditory or visual hallucinations.  No SI or HI.  He does have 1 prior hospitalization in 2015 for major depressive disorder.  He is a previous patient of retired Dr. Beverly Milch in this office.  Past psychiatric medication trials: Zoloft Adderall Ritalin Vyvanse      Visit Diagnosis:    ICD-10-CM   1. Generalized anxiety disorder  F41.1  Vilazodone HCl 20 MG TABS    lamoTRIgine (LAMICTAL) 25 MG tablet    2. Major depressive disorder, recurrent episode, moderate (HCC)  F33.1 Vilazodone HCl 20 MG TABS    lamoTRIgine (LAMICTAL) 25 MG tablet    3. Unspecified mood (affective) disorder (HCC)  F39 Vilazodone HCl 20 MG TABS    lamoTRIgine (LAMICTAL) 25 MG tablet      Past Psychiatric History: ADHD, MDD, Anxiety  Past Medical History:  Past Medical History:  Diagnosis Date   ADHD (attention deficit hyperactivity disorder), inattentive type    Allergy    Anxiety    a. Anxiety episode freshman year in high school.   Depression    Sinus tachycardia    a. 04/2017 Echo [facility]): nl LV/RV fxn, structurally nl heart; b. 05/2017 Event monitor (2 wks): Avg rate 82 bpm, Min HR 47, max HR 162. Predominantly sinus rhythm. Isolated SVE's/couplets (rare - <1%).    Past Surgical History:  Procedure Laterality Date   TONSILLECTOMY      Family Psychiatric History: see chart  Family History:  Family History  Problem Relation Age of Onset   OCD Mother    Anxiety disorder Mother    Bipolar disorder Mother    Depression Mother    Diabetes Mother        Type 2   Drug abuse Father    Bipolar disorder Father    Depression Father    Depression Maternal Uncle    Anxiety  disorder Maternal Uncle    Bipolar disorder Maternal Uncle    Cancer Maternal Grandfather    Depression Maternal Grandmother    Anxiety disorder Maternal Grandmother    COPD Maternal Grandmother    Bipolar disorder Maternal Grandmother    Cancer Paternal Grandmother     Social History:  Social History   Socioeconomic History   Marital status: Significant Other    Spouse name: Shawn   Number of children: Not on file   Years of education: 14   Highest education level: Bachelor's degree (e.g., BA, AB, BS)  Occupational History   Occupation: Parademic    Employer: [employer]  Tobacco Use   Smoking status: Never   Smokeless tobacco: Never  Vaping Use    Vaping status: Every Day   Substances: Nicotine  Substance and Sexual Activity   Alcohol use: Yes    Alcohol/week: 2.0 - 3.0 standard drinks of alcohol    Types: 2 - 3 Glasses of wine per week    Comment: light social   Drug use: No   Sexual activity: Yes  Other Topics Concern   Not on file  Social History Narrative   Lives in [place] in single dwelling home with significant other. Enjoys reading, tv shows in free time.    Social Determinants of Health   Financial Resource Strain: Not on file  Food Insecurity: Not on file  Transportation Needs: Not on file  Physical Activity: Not on file  Stress: Not on file  Social Connections: Not on file    Allergies: No Known Allergies  Metabolic Disorder Labs: No results found for: "HGBA1C", "MPG" No results found for: "PROLACTIN" Lab Results  Component Value Date   CHOL 176 06/20/2022   TRIG 107 06/20/2022   HDL 38 (L) 06/20/2022   CHOLHDL 4.6 06/20/2022   LDLCALC 118 (H) 06/20/2022   LDLCALC 134 (H) 05/18/2021   Lab Results  Component Value Date   TSH 2.550 06/20/2022   TSH 3.870 05/08/2020    Therapeutic Level Labs: No results found for: "LITHIUM" No results found for: "VALPROATE" No results found for: "CBMZ"  Current Medications: Current Outpatient Medications  Medication Sig Dispense Refill   lamoTRIgine (LAMICTAL) 25 MG tablet Take one tablet by mouth for 14 days, then two tablets daily (50 mg total). 60 tablet 1   Vilazodone HCl 20 MG TABS Take 1/2 tablet by mouth for 7 days, then one whole tablet daily. Must take with food. 30 tablet 1   cetirizine (ZYRTEC) 10 MG tablet Take 10 mg by mouth 2 (two) times daily.     lisdexamfetamine (VYVANSE) 20 MG capsule Take 1 capsule (20 mg total) by mouth daily. 30 capsule 0   [START ON 11/21/2022] lisdexamfetamine (VYVANSE) 20 MG capsule Take 1 capsule (20 mg total) by mouth daily. 30 capsule 0   [START ON 12/22/2022] lisdexamfetamine (VYVANSE) 20 MG capsule Take 1 capsule (20  mg total) by mouth daily. 30 capsule 0   No current facility-administered medications for this visit.    Medication Side Effects: none  Orders placed this visit:  No orders of the defined types were placed in this encounter.   Psychiatric Specialty Exam:  Review of Systems  Constitutional: Negative.   Allergic/Immunologic: Negative.   Neurological: Negative.   Psychiatric/Behavioral:  Positive for dysphoric mood. The patient is nervous/anxious.     Blood pressure 129/81, pulse 91, height 5\' 8"  (1.727 m), weight 155 lb (70.3 kg).Body mass index is 23.57 kg/m.  General  Appearance: Casual, Neat, and Well Groomed  Eye Contact:  Good  Speech:  Clear and Coherent  Volume:  Normal  Mood:  NA  Affect:  Appropriate  Thought Process:  Coherent  Orientation:  Full (Time, Place, and Person)  Thought Content: Logical   Suicidal Thoughts:  No  Homicidal Thoughts:  No  Memory:  WNL  Judgement:  Good  Insight:  Good  Psychomotor Activity:  Normal  Concentration:  Concentration: Good  Recall:  Good  Fund of Knowledge: Good  Language: Good  Assets:  Desire for Improvement  ADL's:  Intact  Cognition: WNL  Prognosis:  Good   Screenings:  GAD-7    Flowsheet Row Video Visit from 10/21/2022 in [facility] Primary Care & Sports Medicine at [facility] Office Visit from 09/22/2022 in [facility] Primary Care & Sports Medicine at [facility] Office Visit from 07/08/2022 in [facility] Primary Care & Sports Medicine at [facility]  Total GAD-7 Score 7 8 0      PHQ2-9    Flowsheet Row Office Visit from 11/03/2022 in [facility] Video Visit from 10/21/2022 in [facility] Primary Care & Sports Medicine at [facility] Office Visit from 09/22/2022 in [facility] Primary Care & Sports Medicine at [facility] Office Visit from 07/08/2022 in [facility] Primary Care & Sports Medicine at [facility] Total Score 3 0 1 0  PHQ-9  Total Score 9 5 5  --      Flowsheet Row ED from 03/19/2022 in [facility] Urgent Care at [facility] ED from 10/30/2021 in [facility] Urgent Care at [place] ED from 02/24/2021 in [facility] at [facility] RISK CATEGORY No Risk No Risk No Risk       Receiving Psychotherapy: No   Treatment Plan/Recommendations:  Greater than 50% of 60 min face to face time with patient was spent on counseling and coordination of care. We discussed his long history of ADHD stemming back to fourth or fifth grade.  We talked about his bouts of depression beginning in 2014 and then again in 2020.  We discussed his previous medications and plan of care.  Talked about his report of more frequent mood swings occurring over the last 1.5 years.  His family and friends became more concerned.  We reviewed medication options.  We agreed today to: Will start Viibryd 10 mg for 7 days then 20 mg daily.  Must take with food To start Lamictal 25 mg for 14 days, then 50 mg daily Will follow-up in 4 weeks to reassess Provided emergency contact information To report side effects or worsening symptoms promptly Monitor for any sign of rash. Please taking Lamictal and contact office immediately rash develops. Recommend seeking urgent medical attention if rash is severe and/or spreading quickly.  Reviewed PDMP       Joan Flores, NP

## 2022-11-30 ENCOUNTER — Encounter: Payer: Self-pay | Admitting: Behavioral Health

## 2022-11-30 ENCOUNTER — Ambulatory Visit (INDEPENDENT_AMBULATORY_CARE_PROVIDER_SITE_OTHER): Payer: 59 | Admitting: Behavioral Health

## 2022-11-30 ENCOUNTER — Other Ambulatory Visit (HOSPITAL_BASED_OUTPATIENT_CLINIC_OR_DEPARTMENT_OTHER): Payer: Self-pay

## 2022-11-30 ENCOUNTER — Other Ambulatory Visit: Payer: Self-pay

## 2022-11-30 DIAGNOSIS — F39 Unspecified mood [affective] disorder: Secondary | ICD-10-CM

## 2022-11-30 DIAGNOSIS — F331 Major depressive disorder, recurrent, moderate: Secondary | ICD-10-CM | POA: Diagnosis not present

## 2022-11-30 DIAGNOSIS — F411 Generalized anxiety disorder: Secondary | ICD-10-CM

## 2022-11-30 DIAGNOSIS — R454 Irritability and anger: Secondary | ICD-10-CM | POA: Diagnosis not present

## 2022-11-30 MED ORDER — LORAZEPAM 0.5 MG PO TABS
0.5000 mg | ORAL_TABLET | Freq: Three times a day (TID) | ORAL | 1 refills | Status: DC
  Filled 2022-11-30: qty 20, 7d supply, fill #0

## 2022-11-30 MED ORDER — LAMOTRIGINE 100 MG PO TABS
100.0000 mg | ORAL_TABLET | Freq: Every day | ORAL | 1 refills | Status: DC
  Filled 2022-11-30: qty 30, 30d supply, fill #0
  Filled 2023-01-05: qty 30, 30d supply, fill #1

## 2022-11-30 NOTE — Progress Notes (Signed)
[facility] Med Check  Patient ID: Damon Avila,  MRN: [id number]  PCP: Alyson Reedy, FNP  Date of Evaluation: 11/30/2022 Time spent:30 minutes  Chief Complaint:  Chief Complaint   Anxiety; Depression; Follow-up; Medication Refill; Patient Education; Medication Problem     HISTORY/CURRENT STATUS: HPI "Damon Avila", 22 year old male presents to this office for initial visit and to establish care.  Collateral information should be considered reliable.  Patient is very pleasant, cooperative, and articulate.  Says he has not notice much improvement with anger or mood swings since last visit. Only about 20% better with anxiety and depression.  Asking for more help with his anxiety. No success with prior trial of Inderal or hydroxyzine.  He denies any current mania today, no psychosis, no history of auditory or visual hallucinations.  No SI or HI.  He does have 1 prior hospitalization in 2015 for major depressive disorder.  Past psychiatric medication trials: Zoloft Adderall Ritalin Vyvanse     Individual Medical History/ Review of Systems: Changes? :No   Allergies: Patient has no known allergies.  Current Medications:  Current Outpatient Medications:    lamoTRIgine (LAMICTAL) 100 MG tablet, Take 1 tablet (100 mg total) by mouth daily., Disp: 30 tablet, Rfl: 1   LORazepam (ATIVAN) 0.5 MG tablet, Take 1 tablet (0.5 mg total) by mouth every 8 (eight) hours., Disp: 20 tablet, Rfl: 1   cetirizine (ZYRTEC) 10 MG tablet, Take 10 mg by mouth 2 (two) times daily., Disp: , Rfl:    lamoTRIgine (LAMICTAL) 25 MG tablet, Take one tablet by mouth for 14 days, then two tablets daily (50 mg total)., Disp: 60 tablet, Rfl: 1   lisdexamfetamine (VYVANSE) 20 MG capsule, Take 1 capsule (20 mg total) by mouth daily., Disp: 30 capsule, Rfl: 0   lisdexamfetamine (VYVANSE) 20 MG capsule, Take 1 capsule (20 mg total) by mouth daily., Disp: 30 capsule, Rfl: 0   [START ON 12/22/2022] lisdexamfetamine  (VYVANSE) 20 MG capsule, Take 1 capsule (20 mg total) by mouth daily., Disp: 30 capsule, Rfl: 0   Vilazodone HCl 20 MG TABS, Take 1/2 tablet by mouth for 7 days, then one whole tablet daily. Must take with food., Disp: 30 tablet, Rfl: 1 Medication Side Effects: none  Family Medical/ Social History: Changes? No  MENTAL HEALTH EXAM:  There were no vitals taken for this visit.There is no height or weight on file to calculate BMI.  General Appearance: Casual and Well Groomed  Eye Contact:  Good  Speech:  Clear and Coherent  Volume:  Normal  Mood:  Anxious  Affect:  Appropriate and Anxious  Thought Process:  Coherent  Orientation:  Full (Time, Place, and Person)  Thought Content: Logical   Suicidal Thoughts:  No  Homicidal Thoughts:  No  Memory:  WNL  Judgement:  Good  Insight:  Good  Psychomotor Activity:  Normal  Concentration:  Concentration: Good  Recall:  Good  Fund of Knowledge: Good  Language: Good  Assets:  Desire for Improvement  ADL's:  Intact  Cognition: WNL  Prognosis:  Good    DIAGNOSES:    ICD-10-CM   1. Unspecified mood (affective) disorder (HCC)  F39 lamoTRIgine (LAMICTAL) 100 MG tablet    2. Irritability  R45.4 lamoTRIgine (LAMICTAL) 100 MG tablet    3. Generalized anxiety disorder  F41.1 LORazepam (ATIVAN) 0.5 MG tablet    4. Major depressive disorder, recurrent episode, moderate (HCC)  F33.1       Receiving Psychotherapy: No    RECOMMENDATIONS:  Greater than 50% of 60 min face to face time with patient was spent on counseling and coordination of care. Discussed his minimal improvement since initial visit. He is still having problems with increased irritability and anger. We talked about his current relationship problems with increased stressors and anxiety. He has previous hx of hydroxyzine and Inderal not being effective for anxiety.   We reviewed medication options.   We agreed today to: Continue Viibryd 20 mg daily Increase Lamictal to 100 mg  daily To start Ativan, #20, 0.5 mg daily as needed only for severe anixety Will follow-up in 4 weeks to reassess Provided emergency contact information To report side effects or worsening symptoms promptly Monitor for any sign of rash. Please taking Lamictal and contact office immediately rash develops. Recommend seeking urgent medical attention if rash is severe and/or spreading quickly.  Reviewed PDMP        Joan Flores, NP

## 2022-12-01 ENCOUNTER — Other Ambulatory Visit (HOSPITAL_BASED_OUTPATIENT_CLINIC_OR_DEPARTMENT_OTHER): Payer: Self-pay

## 2022-12-06 ENCOUNTER — Ambulatory Visit (HOSPITAL_BASED_OUTPATIENT_CLINIC_OR_DEPARTMENT_OTHER): Payer: No Typology Code available for payment source | Admitting: Family Medicine

## 2022-12-06 ENCOUNTER — Encounter (HOSPITAL_BASED_OUTPATIENT_CLINIC_OR_DEPARTMENT_OTHER): Payer: Self-pay | Admitting: Family Medicine

## 2022-12-06 ENCOUNTER — Other Ambulatory Visit (HOSPITAL_BASED_OUTPATIENT_CLINIC_OR_DEPARTMENT_OTHER): Payer: Self-pay

## 2022-12-06 VITALS — BP 128/87 | HR 112 | Ht 68.0 in | Wt 154.2 lb

## 2022-12-06 DIAGNOSIS — Z23 Encounter for immunization: Secondary | ICD-10-CM

## 2022-12-06 DIAGNOSIS — Z7252 High risk homosexual behavior: Secondary | ICD-10-CM

## 2022-12-06 DIAGNOSIS — Z79899 Other long term (current) drug therapy: Secondary | ICD-10-CM

## 2022-12-06 MED ORDER — EMTRICITABINE-TENOFOVIR AF 200-25 MG PO TABS
1.0000 | ORAL_TABLET | Freq: Every day | ORAL | 2 refills | Status: DC
  Filled 2022-12-06: qty 30, 30d supply, fill #0

## 2022-12-06 NOTE — Assessment & Plan Note (Signed)
Patient presents today to discuss PrEP therapy. He would prefer injection but does not want to see infectious disease specialist at this time. Will obtain labs (see list below) prior to starting Descovy. Patient was on in the past and tolerated it well. Will send in medication once lab work returns.

## 2022-12-06 NOTE — Progress Notes (Signed)
   Established Patient Office Visit  Subjective   Patient ID: Damon Avila, male    DOB: 2001/01/18  Age: 22 y.o. MRN: [id number]  Dasir Werito is a 22 year-old male patient who presents today to discuss PrEP therapy. He recently got out of a relationship. Denies exposure to recent STIs or symptoms.  He has been on Descovy in the past and tolerated it well.   Review of Systems  Constitutional:  Negative for malaise/fatigue.  Eyes:  Negative for blurred vision and double vision.  Respiratory:  Negative for cough and shortness of breath.   Cardiovascular:  Negative for chest pain, palpitations and leg swelling.  Gastrointestinal:  Negative for abdominal pain, nausea and vomiting.  Genitourinary:  Negative for frequency and urgency.  Musculoskeletal:  Negative for myalgias.  Neurological:  Negative for dizziness, weakness and headaches.  Psychiatric/Behavioral:  Negative for depression and suicidal ideas. The patient is not nervous/anxious and does not have insomnia.      Objective:    BP 128/87   Pulse (!) 112   Ht 5\' 8"  (1.727 m)   Wt 154 lb 3.2 oz (69.9 kg)   SpO2 99%   BMI 23.45 kg/m  BP Readings from Last 3 Encounters:  12/06/22 128/87  11/03/22 129/81  09/22/22 127/80    Physical Exam Constitutional:      Appearance: Normal appearance.  Cardiovascular:     Rate and Rhythm: Regular rhythm. Tachycardia present.     Pulses: Normal pulses.     Heart sounds: Normal heart sounds.  Pulmonary:     Effort: Pulmonary effort is normal.     Breath sounds: Normal breath sounds.  Neurological:     Mental Status: He is alert.  Psychiatric:        Mood and Affect: Mood normal.        Behavior: Behavior normal.        Thought Content: Thought content normal.        Judgment: Judgment normal.      Assessment & Plan:   High risk homosexual behavior Assessment & Plan: Patient presents today to discuss PrEP therapy. He would prefer injection but does not want to see  infectious disease specialist at this time. Will obtain labs (see list below) prior to starting Descovy. Patient was on in the past and tolerated it well. Will send in medication once lab work returns.   Orders: -     Comprehensive metabolic panel -     Hepatitis B surface antigen -     Hepatitis B core antibody, IgM -     Hepatitis B surface antibody,qualitative -     Lipid panel -     RPR+HIV+GC+CT Panel -     HCV RNA quant rflx ultra or genotyp -     Flu vaccine trivalent PF, 6mos and older(Flulaval,Afluria,Fluarix,Fluzone)  On pre-exposure prophylaxis for HIV- history of not on currently  -     Comprehensive metabolic panel -     Hepatitis B surface antigen -     Hepatitis B core antibody, IgM -     Hepatitis B surface antibody,qualitative -     Lipid panel -     RPR+HIV+GC+CT Panel -     HCV RNA quant rflx ultra or genotyp -     Flu vaccine trivalent PF, 6mos and older(Flulaval,Afluria,Fluarix,Fluzone)  Encounter for immunization    Return in about 3 months (around 03/07/2023) for PrEP therapy .    Alyson Reedy, FNP

## 2022-12-08 ENCOUNTER — Telehealth (HOSPITAL_BASED_OUTPATIENT_CLINIC_OR_DEPARTMENT_OTHER): Payer: Self-pay | Admitting: Family Medicine

## 2022-12-08 LAB — COMPREHENSIVE METABOLIC PANEL
ALT: 11 IU/L (ref 0–44)
AST: 17 IU/L (ref 0–40)
Albumin: 4.8 g/dL (ref 4.3–5.2)
Alkaline Phosphatase: 93 IU/L (ref 44–121)
BUN/Creatinine Ratio: 7 — ABNORMAL LOW (ref 9–20)
BUN: 9 mg/dL (ref 6–20)
Bilirubin Total: 0.7 mg/dL (ref 0.0–1.2)
CO2: 19 mmol/L — ABNORMAL LOW (ref 20–29)
Calcium: 9.8 mg/dL (ref 8.7–10.2)
Chloride: 101 mmol/L (ref 96–106)
Creatinine, Ser: 1.26 mg/dL (ref 0.76–1.27)
Globulin, Total: 2.9 g/dL (ref 1.5–4.5)
Glucose: 109 mg/dL — ABNORMAL HIGH (ref 70–99)
Potassium: 3.9 mmol/L (ref 3.5–5.2)
Sodium: 138 mmol/L (ref 134–144)
Total Protein: 7.7 g/dL (ref 6.0–8.5)
eGFR: 82 mL/min/{1.73_m2} (ref >59)

## 2022-12-08 LAB — LIPID PANEL
Chol/HDL Ratio: 4.6 ratio (ref 0.0–5.0)
Cholesterol, Total: 166 mg/dL (ref 100–199)
HDL: 36 mg/dL — ABNORMAL LOW (ref >39)
LDL Chol Calc (NIH): 113 mg/dL — ABNORMAL HIGH (ref 0–99)
Triglycerides: 88 mg/dL (ref 0–149)
VLDL Cholesterol Cal: 17 mg/dL (ref 5–40)

## 2022-12-08 LAB — RPR+HIV+GC+CT PANEL
Chlamydia trachomatis, NAA: NEGATIVE
HIV Screen 4th Generation wRfx: NONREACTIVE
Neisseria Gonorrhoeae by PCR: NEGATIVE
RPR Ser Ql: NONREACTIVE

## 2022-12-08 LAB — HEPATITIS B SURFACE ANTIBODY,QUALITATIVE: Hep B Surface Ab, Qual: REACTIVE

## 2022-12-08 LAB — HEPATITIS B SURFACE ANTIGEN: Hepatitis B Surface Ag: NEGATIVE

## 2022-12-08 LAB — HEPATITIS B CORE ANTIBODY, IGM: Hep B C IgM: NEGATIVE

## 2022-12-08 LAB — HCV RNA QUANT RFLX ULTRA OR GENOTYP: HCV Quant Baseline: NOT DETECTED [IU]/mL

## 2022-12-08 NOTE — Telephone Encounter (Signed)
Spoke with patient regarding lab results. He is moving to a different address and his medication has to be sent to a speciality pharmacy. He plans to update his address in MyChart and I will plan to wait and send medication on 12/09/2022.

## 2022-12-09 ENCOUNTER — Other Ambulatory Visit (HOSPITAL_COMMUNITY): Payer: Self-pay

## 2022-12-09 ENCOUNTER — Other Ambulatory Visit (HOSPITAL_BASED_OUTPATIENT_CLINIC_OR_DEPARTMENT_OTHER): Payer: Self-pay | Admitting: Family Medicine

## 2022-12-09 ENCOUNTER — Other Ambulatory Visit (HOSPITAL_BASED_OUTPATIENT_CLINIC_OR_DEPARTMENT_OTHER): Payer: Self-pay

## 2022-12-09 ENCOUNTER — Other Ambulatory Visit: Payer: Self-pay

## 2022-12-09 DIAGNOSIS — Z79899 Other long term (current) drug therapy: Secondary | ICD-10-CM

## 2022-12-09 DIAGNOSIS — Z7252 High risk homosexual behavior: Secondary | ICD-10-CM

## 2022-12-09 MED ORDER — EMTRICITABINE-TENOFOVIR AF 200-25 MG PO TABS
1.0000 | ORAL_TABLET | Freq: Every day | ORAL | 3 refills | Status: DC
  Filled 2022-12-09: qty 30, 30d supply, fill #0

## 2022-12-15 ENCOUNTER — Telehealth (HOSPITAL_BASED_OUTPATIENT_CLINIC_OR_DEPARTMENT_OTHER): Payer: Self-pay | Admitting: *Deleted

## 2022-12-15 NOTE — Telephone Encounter (Signed)
Pt LVM regarding prescription refill  Attempted to call pt back LVM

## 2022-12-17 ENCOUNTER — Encounter (HOSPITAL_COMMUNITY): Payer: Self-pay

## 2022-12-19 ENCOUNTER — Other Ambulatory Visit (HOSPITAL_BASED_OUTPATIENT_CLINIC_OR_DEPARTMENT_OTHER): Payer: Self-pay

## 2022-12-19 ENCOUNTER — Other Ambulatory Visit (HOSPITAL_BASED_OUTPATIENT_CLINIC_OR_DEPARTMENT_OTHER): Payer: Self-pay | Admitting: Family Medicine

## 2022-12-19 ENCOUNTER — Telehealth (HOSPITAL_BASED_OUTPATIENT_CLINIC_OR_DEPARTMENT_OTHER): Payer: Self-pay | Admitting: Family Medicine

## 2022-12-19 ENCOUNTER — Other Ambulatory Visit: Payer: Self-pay

## 2022-12-19 DIAGNOSIS — Z7252 High risk homosexual behavior: Secondary | ICD-10-CM

## 2022-12-19 DIAGNOSIS — Z79899 Other long term (current) drug therapy: Secondary | ICD-10-CM

## 2022-12-19 MED ORDER — EMTRICITABINE-TENOFOVIR AF 200-25 MG PO TABS: 1.0000 | ORAL_TABLET | Freq: Every day | ORAL | 3 refills | Status: DC

## 2022-12-19 NOTE — Telephone Encounter (Signed)
Please call the patient --regarding specialty Pharmacy

## 2022-12-19 NOTE — Telephone Encounter (Signed)
Spoke with patient, discussed pharmacies will call him back tomorrow with an update

## 2022-12-20 ENCOUNTER — Encounter (HOSPITAL_BASED_OUTPATIENT_CLINIC_OR_DEPARTMENT_OTHER): Payer: Self-pay

## 2022-12-20 NOTE — Telephone Encounter (Signed)
Medication sent to [facility] pharmacy. Received notification it may need a PA, initiated PA and received a message stating I needed to call "[facility]" to continue PA. Attempted to call them, no answer will try again and update patient with outcome

## 2022-12-20 NOTE — Telephone Encounter (Signed)
Patient updated via MyChart, awaiting PA decision

## 2022-12-22 ENCOUNTER — Telehealth: Payer: Self-pay | Admitting: Behavioral Health

## 2022-12-22 DIAGNOSIS — F331 Major depressive disorder, recurrent, moderate: Secondary | ICD-10-CM

## 2022-12-22 DIAGNOSIS — F39 Unspecified mood [affective] disorder: Secondary | ICD-10-CM

## 2022-12-22 DIAGNOSIS — F411 Generalized anxiety disorder: Secondary | ICD-10-CM

## 2022-12-22 NOTE — Telephone Encounter (Signed)
Please call patient and ask if he is having any issues that need to be addressed before his next visit. If he needs RF we can send in RF until he can be seen.

## 2022-12-22 NOTE — Telephone Encounter (Signed)
Damon Avila had to move his appt from 9/27 and he isn't scheduled until 10/15. Damon Avila was making some med adjustments and pt is not sure what to do . Also will need RF before next appt.

## 2022-12-23 ENCOUNTER — Ambulatory Visit: Payer: 59 | Admitting: Behavioral Health

## 2022-12-23 NOTE — Telephone Encounter (Signed)
His only concern was if he was going to have enough Vilazodone 20 mg. I let him know that we would send a RF in until his next appt.

## 2022-12-23 NOTE — Telephone Encounter (Signed)
Told patient to call when he needed a RF and we would send in one.

## 2022-12-29 ENCOUNTER — Other Ambulatory Visit (HOSPITAL_COMMUNITY): Payer: Self-pay

## 2022-12-29 ENCOUNTER — Other Ambulatory Visit: Payer: Self-pay

## 2022-12-29 ENCOUNTER — Other Ambulatory Visit (HOSPITAL_BASED_OUTPATIENT_CLINIC_OR_DEPARTMENT_OTHER): Payer: Self-pay | Admitting: Family Medicine

## 2022-12-29 ENCOUNTER — Encounter (HOSPITAL_COMMUNITY): Payer: Self-pay

## 2022-12-29 MED ORDER — EMTRICITABINE-TENOFOVIR DF 200-300 MG PO TABS
1.0000 | ORAL_TABLET | Freq: Every day | ORAL | 2 refills | Status: DC
  Filled 2022-12-29: qty 30, 30d supply, fill #0
  Filled 2023-01-18: qty 30, 30d supply, fill #1
  Filled 2023-04-02 – 2023-04-26 (×2): qty 30, 30d supply, fill #2

## 2022-12-29 NOTE — Progress Notes (Signed)
Specialty Pharmacy Initial Fill Coordination Note  Damon Avila is a 22 y.o. male contacted today regarding refills of specialty medication(s) Emtricitabine-Tenofovir Df   Patient requested Damon Avila at [facility] [place] date: 12/30/22   Medication will be filled on 10/3.   Patient is aware of $0 copayment.

## 2022-12-29 NOTE — Progress Notes (Signed)
Specialty Pharmacy Initiation Note   Damon Avila is a 22 y.o. male who will be followed by the specialty pharmacy service for RxSp HIV PrEP    Review of administration, indication, effectiveness, safety, potential side effects, storage/disposable, and missed dose instructions occurred today for patient's specialty medication(s) Emtricitabine-Tenofovir Df     Patient did not have any additional questions or concerns.   Patient's therapy is appropriate to: Continue    Goals Addressed             This Visit's Progress    Maintain optimal adherence to therapy       Patient is on track. Patient will maintain adherence           We will follow up in 6 months.

## 2023-01-05 ENCOUNTER — Other Ambulatory Visit (HOSPITAL_BASED_OUTPATIENT_CLINIC_OR_DEPARTMENT_OTHER): Payer: Self-pay

## 2023-01-05 ENCOUNTER — Other Ambulatory Visit: Payer: Self-pay | Admitting: Behavioral Health

## 2023-01-05 ENCOUNTER — Other Ambulatory Visit: Payer: Self-pay

## 2023-01-05 DIAGNOSIS — F331 Major depressive disorder, recurrent, moderate: Secondary | ICD-10-CM

## 2023-01-05 DIAGNOSIS — F411 Generalized anxiety disorder: Secondary | ICD-10-CM

## 2023-01-05 DIAGNOSIS — F39 Unspecified mood [affective] disorder: Secondary | ICD-10-CM

## 2023-01-05 MED ORDER — VILAZODONE HCL 20 MG PO TABS
ORAL_TABLET | ORAL | 0 refills | Status: DC
  Filled 2023-01-05: qty 30, 30d supply, fill #0

## 2023-01-10 ENCOUNTER — Ambulatory Visit (INDEPENDENT_AMBULATORY_CARE_PROVIDER_SITE_OTHER): Payer: Self-pay | Admitting: Behavioral Health

## 2023-01-10 DIAGNOSIS — Z0389 Encounter for observation for other suspected diseases and conditions ruled out: Secondary | ICD-10-CM

## 2023-01-10 NOTE — Progress Notes (Signed)
Pt did not show for scheduled appt and did not provide 24 hour notice as required. Additional fees to be assessed.  

## 2023-01-16 ENCOUNTER — Other Ambulatory Visit (HOSPITAL_BASED_OUTPATIENT_CLINIC_OR_DEPARTMENT_OTHER): Payer: Self-pay

## 2023-01-16 ENCOUNTER — Ambulatory Visit (INDEPENDENT_AMBULATORY_CARE_PROVIDER_SITE_OTHER): Payer: 59 | Admitting: Behavioral Health

## 2023-01-16 ENCOUNTER — Encounter: Payer: Self-pay | Admitting: Behavioral Health

## 2023-01-16 DIAGNOSIS — F411 Generalized anxiety disorder: Secondary | ICD-10-CM

## 2023-01-16 DIAGNOSIS — F39 Unspecified mood [affective] disorder: Secondary | ICD-10-CM

## 2023-01-16 DIAGNOSIS — F331 Major depressive disorder, recurrent, moderate: Secondary | ICD-10-CM | POA: Diagnosis not present

## 2023-01-16 MED ORDER — LAMOTRIGINE 150 MG PO TABS
150.0000 mg | ORAL_TABLET | Freq: Every day | ORAL | 1 refills | Status: DC
  Filled 2023-01-16 – 2023-01-25 (×2): qty 30, 30d supply, fill #0

## 2023-01-16 MED ORDER — VILAZODONE HCL 40 MG PO TABS
40.0000 mg | ORAL_TABLET | Freq: Every day | ORAL | 1 refills | Status: DC
  Filled 2023-01-16 – 2023-01-25 (×2): qty 30, 30d supply, fill #0

## 2023-01-16 NOTE — Progress Notes (Signed)
[facility]  Patient ID: FERRIN CREACH,  MRN: [id number]  PCP: Alyson Reedy, FNP  Date of Evaluation: 01/16/2023 Time spent:30 minutes  Chief Complaint:  Chief Complaint   Anxiety; Depression; Follow-up; Patient Education; Medication Refill; Stress     HISTORY/CURRENT STATUS: HPI  "Damon Avila", 22 year old male presents to this office for initial visit and to establish care.  Collateral information should be considered reliable.  Patient is very pleasant, cooperative, and articulate.  Says his depression has become much worse since last visit. Says he is still adjusting after breakup with his partner. He has been less social and not getting out of the house much.   Reports anxiety at 7/10 and depression at 9/10.  Sleeping 7-8 hours per night.   He denies any current mania today, no psychosis, no history of auditory or visual hallucinations.  No SI or HI.  He does have 1 prior hospitalization in 2015 for major depressive disorder.  Past psychiatric medication trials: Zoloft Adderall Ritalin Vyvanse      Individual Medical History/ Review of Systems: Changes? :No   Allergies: Patient has no known allergies.  Current Medications:  Current Outpatient Medications:    cetirizine (ZYRTEC) 10 MG tablet, Take 10 mg by mouth 2 (two) times daily., Disp: , Rfl:    emtricitabine-tenofovir (TRUVADA) 200-300 MG tablet, Take 1 tablet by mouth daily., Disp: 30 tablet, Rfl: 2   lamoTRIgine (LAMICTAL) 100 MG tablet, Take 1 tablet (100 mg total) by mouth daily., Disp: 30 tablet, Rfl: 1   lamoTRIgine (LAMICTAL) 25 MG tablet, Take one tablet by mouth for 14 days, then two tablets daily (50 mg total). (Patient not taking: Reported on 12/06/2022), Disp: 60 tablet, Rfl: 1   lisdexamfetamine (VYVANSE) 20 MG capsule, Take 1 capsule (20 mg total) by mouth daily., Disp: 30 capsule, Rfl: 0   lisdexamfetamine (VYVANSE) 20 MG capsule, Take 1 capsule (20 mg total) by mouth daily., Disp: 30  capsule, Rfl: 0   lisdexamfetamine (VYVANSE) 20 MG capsule, Take 1 capsule (20 mg total) by mouth daily., Disp: 30 capsule, Rfl: 0   LORazepam (ATIVAN) 0.5 MG tablet, Take 1 tablet (0.5 mg total) by mouth every 8 (eight) hours., Disp: 20 tablet, Rfl: 1   Vilazodone HCl 20 MG TABS, Take one tablet daily. Must take with food., Disp: 30 tablet, Rfl: 0 Medication Side Effects: none  Family Medical/ Social History: Changes? No  MENTAL HEALTH EXAM:  There were no vitals taken for this visit.There is no height or weight on file to calculate BMI.  General Appearance: Casual, Neat, and Well Groomed  Eye Contact:  Good  Speech:  Clear and Coherent  Volume:  Normal  Mood:  Anxious, Depressed, and Dysphoric  Affect:  Depressed, Flat, and Anxious  Thought Process:  Coherent  Orientation:  Full (Time, Place, and Person)  Thought Content: Logical   Suicidal Thoughts:  No  Homicidal Thoughts:  No  Memory:  WNL  Judgement:  Good  Insight:  Good  Psychomotor Activity:  Normal  Concentration:  Concentration: Good  Recall:  Good  Fund of Knowledge: Good  Language: Good  Assets:  Desire for Improvement  ADL's:  Intact  Cognition: WNL  Prognosis:  Good    DIAGNOSES:    ICD-10-CM   1. Generalized anxiety disorder  F41.1     2. Major depressive disorder, recurrent episode, moderate (HCC)  F33.1     3. Unspecified mood (affective) disorder (HCC)  F39       Receiving  Psychotherapy: No    RECOMMENDATIONS:  Greater than 50% of 30 min face to face time with patient was spent on counseling and coordination of care. Discussed worsening symptoms of depression since last visit. Encouraged pt to  exercise more, go for walks outside, increase uv exposure on nice days.  We talked about medication options.  Reconciled medications.   We agreed today to: Increase Viibryd to 40 mg daily Increase Lamictal to 150 mg daily To start Ativan, #20, 0.5 mg daily as needed only for severe anixety Will  follow-up in 6  weeks to reassess Provided emergency contact information To report side effects or worsening symptoms promptly Monitor for any sign of rash. Please taking Lamictal and contact office immediately rash develops. Recommend seeking urgent medical attention if rash is severe and/or spreading quickly.  Reviewed PDMP         Joan Flores, NP

## 2023-01-18 ENCOUNTER — Other Ambulatory Visit (HOSPITAL_COMMUNITY): Payer: Self-pay

## 2023-01-18 ENCOUNTER — Other Ambulatory Visit: Payer: Self-pay

## 2023-01-18 NOTE — Progress Notes (Signed)
Specialty Pharmacy Refill Coordination Note  Damon Avila is a 22 y.o. male contacted today regarding refills of specialty medication(s) Emtricitabine-Tenofovir Df   Patient requested Delivery   Delivery date: 01/27/23   Verified address: [address] [place] [address]   Medication will be filled on 01/26/23.

## 2023-01-24 NOTE — Telephone Encounter (Signed)
error 

## 2023-01-25 ENCOUNTER — Other Ambulatory Visit (HOSPITAL_BASED_OUTPATIENT_CLINIC_OR_DEPARTMENT_OTHER): Payer: Self-pay

## 2023-02-06 ENCOUNTER — Ambulatory Visit
Admission: EM | Admit: 2023-02-06 | Discharge: 2023-02-06 | Disposition: A | Discharge status: Home / Self Care | Payer: Managed Care, Other (non HMO) | Attending: Internal Medicine | Admitting: Internal Medicine

## 2023-02-06 DIAGNOSIS — M25552 Pain in left hip: Secondary | ICD-10-CM

## 2023-02-06 DIAGNOSIS — M545 Low back pain, unspecified: Secondary | ICD-10-CM

## 2023-02-06 DIAGNOSIS — S161XXA Strain of muscle, fascia and tendon at neck level, initial encounter: Secondary | ICD-10-CM

## 2023-02-06 DIAGNOSIS — M25551 Pain in right hip: Secondary | ICD-10-CM

## 2023-02-06 MED ORDER — METHOCARBAMOL 750 MG PO TABS: 1500.0000 mg | ORAL_TABLET | Freq: Three times a day (TID) | ORAL | 0 refills | Status: DC | PRN

## 2023-02-06 MED ORDER — KETOROLAC TROMETHAMINE 30 MG/ML IJ SOLN
30.0000 mg | Freq: Once | INTRAMUSCULAR | Status: AC
  Administered 2023-02-06: 30 mg via INTRAMUSCULAR

## 2023-02-06 MED ORDER — NAPROXEN 375 MG PO TABS: 375.0000 mg | ORAL_TABLET | Freq: Two times a day (BID) | ORAL | 0 refills | Status: DC | PRN
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
## 2023-02-06 NOTE — Discharge Instructions (Signed)
You were given a Toradol injection in clinic today. Do not take any over the counter NSAID's such as Advil, ibuprofen, Aleve, or naproxen for 24 hours. You may take tylenol if needed.  Start naproxen twice daily as needed tomorrow, 11/12.  Robaxin as needed.  Please of this medication can make you drowsy.  Do not drink alcohol or drive on this medication.  Heat and rest to the areas.  Please follow-up with your PCP if your symptoms do not improve.  Please go to the ER for any worsening symptoms.  I hope you feel better soon!

## 2023-02-16 ENCOUNTER — Encounter (HOSPITAL_BASED_OUTPATIENT_CLINIC_OR_DEPARTMENT_OTHER): Payer: Self-pay | Admitting: Family Medicine

## 2023-02-20 ENCOUNTER — Other Ambulatory Visit: Payer: Self-pay

## 2023-02-22 ENCOUNTER — Other Ambulatory Visit (HOSPITAL_COMMUNITY): Payer: Self-pay

## 2023-02-22 ENCOUNTER — Encounter (HOSPITAL_COMMUNITY): Payer: Self-pay

## 2023-02-24 ENCOUNTER — Other Ambulatory Visit (HOSPITAL_COMMUNITY): Payer: Self-pay

## 2023-02-28 ENCOUNTER — Ambulatory Visit (INDEPENDENT_AMBULATORY_CARE_PROVIDER_SITE_OTHER): Payer: 59 | Admitting: Behavioral Health

## 2023-02-28 ENCOUNTER — Other Ambulatory Visit (HOSPITAL_BASED_OUTPATIENT_CLINIC_OR_DEPARTMENT_OTHER): Payer: Self-pay

## 2023-02-28 ENCOUNTER — Telehealth: Payer: Self-pay | Admitting: Behavioral Health

## 2023-02-28 ENCOUNTER — Encounter: Payer: Self-pay | Admitting: Behavioral Health

## 2023-02-28 ENCOUNTER — Other Ambulatory Visit: Payer: Self-pay

## 2023-02-28 DIAGNOSIS — F411 Generalized anxiety disorder: Secondary | ICD-10-CM | POA: Diagnosis not present

## 2023-02-28 DIAGNOSIS — F39 Unspecified mood [affective] disorder: Secondary | ICD-10-CM

## 2023-02-28 DIAGNOSIS — F331 Major depressive disorder, recurrent, moderate: Secondary | ICD-10-CM | POA: Diagnosis not present

## 2023-02-28 MED ORDER — LAMOTRIGINE 150 MG PO TABS
150.0000 mg | ORAL_TABLET | Freq: Every day | ORAL | 2 refills | Status: DC
  Filled 2023-02-28: qty 30, 30d supply, fill #0
  Filled 2023-04-02: qty 30, 30d supply, fill #1

## 2023-02-28 MED ORDER — LISDEXAMFETAMINE DIMESYLATE 20 MG PO CAPS
20.0000 mg | ORAL_CAPSULE | Freq: Every day | ORAL | 0 refills | Status: DC
  Filled 2023-02-28: qty 30, 30d supply, fill #0

## 2023-02-28 MED ORDER — VILAZODONE HCL 40 MG PO TABS
40.0000 mg | ORAL_TABLET | Freq: Every day | ORAL | 2 refills | Status: DC
  Filled 2023-02-28: qty 30, 30d supply, fill #0
  Filled 2023-04-02: qty 30, 30d supply, fill #1

## 2023-02-28 NOTE — Telephone Encounter (Signed)
Patient lvm stating he had an office visit on today. Patient reported his medications were fill all except the Vyvanse. Please advise. # [phone]

## 2023-02-28 NOTE — Progress Notes (Signed)
[facility]  Patient ID: Damon Avila,  MRN: [id number]  PCP: Alyson Reedy, FNP  Date of Evaluation: 02/28/2023 Time spent:20 minutes  Chief Complaint:  Chief Complaint   Anxiety; Depression; Follow-up; Patient Education; Medication Refill     HISTORY/CURRENT STATUS: HPI "Damon Avila", 22 year old male presents to this office for initial visit and to establish care.  Collateral information should be considered reliable.  Patient is very pleasant, cooperative, and articulate. Feel like the increase in Viibryd last visit has helped significantly in reducing his anxiety and depression. He is requesting no further adjustments to his meds this visit. Reports anxiety at 3/10 and depression at 4/10.  Sleeping 7-8 hours per night.   He denies any current mania today, no psychosis, no history of auditory or visual hallucinations.  No SI or HI.  He does have 1 prior hospitalization in 2015 for major depressive disorder.  Past psychiatric medication trials: Zoloft Adderall Ritalin Vyvanse     Individual Medical History/ Review of Systems: Changes? :No   Allergies: Patient has no known allergies.  Current Medications:  Current Outpatient Medications:    cetirizine (ZYRTEC) 10 MG tablet, Take 10 mg by mouth 2 (two) times daily., Disp: , Rfl:    emtricitabine-tenofovir (TRUVADA) 200-300 MG tablet, Take 1 tablet by mouth daily., Disp: 30 tablet, Rfl: 2   lamoTRIgine (LAMICTAL) 100 MG tablet, Take 1 tablet (100 mg total) by mouth daily., Disp: 30 tablet, Rfl: 1   lamoTRIgine (LAMICTAL) 150 MG tablet, Take 1 tablet (150 mg total) by mouth daily., Disp: 30 tablet, Rfl: 2   lisdexamfetamine (VYVANSE) 20 MG capsule, Take 1 capsule (20 mg total) by mouth daily., Disp: 30 capsule, Rfl: 0   lisdexamfetamine (VYVANSE) 20 MG capsule, Take 1 capsule (20 mg total) by mouth daily., Disp: 30 capsule, Rfl: 0   lisdexamfetamine (VYVANSE) 20 MG capsule, Take 1 capsule (20 mg total) by mouth  daily., Disp: 30 capsule, Rfl: 0   LORazepam (ATIVAN) 0.5 MG tablet, Take 1 tablet (0.5 mg total) by mouth every 8 (eight) hours., Disp: 20 tablet, Rfl: 1   methocarbamol (ROBAXIN-750) 750 MG tablet, Take 2 tablets (1,500 mg total) by mouth 3 (three) times daily as needed for muscle spasms., Disp: 30 tablet, Rfl: 0   naproxen (NAPROSYN) 375 MG tablet, Take 1 tablet (375 mg total) by mouth 2 (two) times daily as needed., Disp: 14 tablet, Rfl: 0   Vilazodone HCl (VIIBRYD) 40 MG TABS, Take 1 tablet (40 mg total) by mouth daily., Disp: 30 tablet, Rfl: 2   Vilazodone HCl 20 MG TABS, Take one tablet daily. Must take with food., Disp: 30 tablet, Rfl: 0 Medication Side Effects: none  Family Medical/ Social History: Changes? No  MENTAL HEALTH EXAM:  There were no vitals taken for this visit.There is no height or weight on file to calculate BMI.  General Appearance: Casual, Neat, and Well Groomed  Eye Contact:  Good  Speech:  Clear and Coherent  Volume:  Normal  Mood:  Anxious, Depressed, and Dysphoric  Affect:  Appropriate  Thought Process:  Coherent  Orientation:  Full (Time, Place, and Person)  Thought Content: Logical   Suicidal Thoughts:  No  Homicidal Thoughts:  No  Memory:  WNL  Judgement:  Good  Insight:  Good  Psychomotor Activity:  Normal  Concentration:  Concentration: Good  Recall:  Good  Fund of Knowledge: Good  Language: Good  Assets:  Desire for Improvement  ADL's:  Intact  Cognition: WNL  Prognosis:  Good    DIAGNOSES:    ICD-10-CM   1. Major depressive disorder, recurrent episode, moderate (HCC)  F33.1 lamoTRIgine (LAMICTAL) 150 MG tablet    Vilazodone HCl (VIIBRYD) 40 MG TABS    2. Unspecified mood (affective) disorder (HCC)  F39 lamoTRIgine (LAMICTAL) 150 MG tablet    Vilazodone HCl (VIIBRYD) 40 MG TABS    3. Generalized anxiety disorder  F41.1 Vilazodone HCl (VIIBRYD) 40 MG TABS      Receiving Psychotherapy: No    RECOMMENDATIONS:   Greater than 50% of  20 min face to face time with patient was spent on counseling and coordination of care. Discussed worsening symptoms of depression since last visit. Encouraged pt to  exercise more, go for walks outside, increase uv exposure on nice days.  We talked about medication options.   Reconciled medications.   We agreed today to: Continue Viibryd to 40 mg daily Continue Lamictal to 150 mg daily Continue Ativan, #20, 0.5 mg daily as needed only for severe anixety Will follow-up in 8  weeks to reassess Provided emergency contact information To report side effects or worsening symptoms promptly Monitor for any sign of rash. Please taking Lamictal and contact office immediately rash develops. Recommend seeking urgent medical attention if rash is severe and/or spreading quickly.  Reviewed PDMP      Joan Flores, NP

## 2023-02-28 NOTE — Telephone Encounter (Signed)
Has been previously prescribed by PCP. Patient said Damon Avila was going to take over prescribing. Pended 20 mg Vyvanse to [facility].

## 2023-03-07 ENCOUNTER — Ambulatory Visit (HOSPITAL_BASED_OUTPATIENT_CLINIC_OR_DEPARTMENT_OTHER): Payer: Managed Care, Other (non HMO) | Admitting: Family Medicine

## 2023-03-08 ENCOUNTER — Encounter (HOSPITAL_BASED_OUTPATIENT_CLINIC_OR_DEPARTMENT_OTHER): Payer: Self-pay | Admitting: Family Medicine

## 2023-03-08 ENCOUNTER — Ambulatory Visit (INDEPENDENT_AMBULATORY_CARE_PROVIDER_SITE_OTHER): Payer: Managed Care, Other (non HMO) | Admitting: Family Medicine

## 2023-03-08 VITALS — BP 106/78 | HR 82 | Ht 68.0 in | Wt 154.8 lb

## 2023-03-08 DIAGNOSIS — Z79899 Other long term (current) drug therapy: Secondary | ICD-10-CM

## 2023-03-08 DIAGNOSIS — Z23 Encounter for immunization: Secondary | ICD-10-CM

## 2023-03-08 DIAGNOSIS — Z7252 High risk homosexual behavior: Secondary | ICD-10-CM

## 2023-03-08 NOTE — Progress Notes (Signed)
   Established Patient Office Visit  Subjective   Patient ID: BURLEN MACARI, male    DOB: [birth year]  Age: 22 y.o. MRN: [id number]  Sauel Robnett is a pleasant 22 year old male patient who presents today for the management of PrEP pharmacotherapy. He reports one new sexual encounter since his last visit but denies any symptoms. Currently tolerating Truvada well- he would be interested at this time to have referral place to infectious disease due to interest in injectable HIV PrEP therapy.   Review of Systems  Constitutional:  Negative for chills, fever, malaise/fatigue and weight loss.  Eyes:  Negative for blurred vision and double vision.  Respiratory:  Negative for cough and shortness of breath.   Cardiovascular:  Negative for chest pain and palpitations.  Gastrointestinal:  Negative for nausea and vomiting.  Genitourinary:  Negative for frequency and urgency.  Musculoskeletal:  Negative for myalgias.  Skin:  Negative for rash.  Neurological:  Negative for dizziness and headaches.  Psychiatric/Behavioral:  Negative for depression and suicidal ideas. The patient is not nervous/anxious and does not have insomnia.       Objective:    BP 106/78   Pulse 82   Ht 5\' 8"  (1.727 m)   Wt 154 lb 12.8 oz (70.2 kg)   SpO2 98%   BMI 23.54 kg/m  BP Readings from Last 3 Encounters:  03/08/23 106/78  02/06/23 117/78  12/06/22 128/87    Physical Exam Vitals reviewed.  Constitutional:      Appearance: Normal appearance.  Cardiovascular:     Rate and Rhythm: Normal rate and regular rhythm.     Pulses: Normal pulses.     Heart sounds: Normal heart sounds.  Pulmonary:     Effort: Pulmonary effort is normal.     Breath sounds: Normal breath sounds.  Neurological:     Mental Status: He is alert.  Psychiatric:        Mood and Affect: Mood normal.        Behavior: Behavior normal.       Assessment & Plan:   1. On pre-exposure prophylaxis for HIV- history of not on currently  Patient  is a pleasant 22 year old male patient who presents for the management of PrEP therapy. Tolerating current medication regimen well. He has had one new sexual partner since his last visit. Denies any STI symptoms and no recent known exposure. Will repeat CMP and STI testing today in lab. Referral placed to ID for further management.  - Comprehensive metabolic panel - RPR+HIV+GC+CT Panel - Ambulatory referral to Infectious Disease  2. High risk homosexual behavior See #1 - Ambulatory referral to Infectious Disease  3. Immunization due Patient agreeable to receive tetanus immunization today and 3rd dose of HPV series.  - Tdap vaccine greater than or equal to 7yo IM - HPV 9-valent vaccine,Recombinat    Return in about 3 months (around 06/19/2023) for Physical with fasting labs.    Alyson Reedy, FNP

## 2023-03-10 LAB — COMPREHENSIVE METABOLIC PANEL
ALT: 12 [IU]/L (ref 0–44)
AST: 16 [IU]/L (ref 0–40)
Albumin: 4.8 g/dL (ref 4.3–5.2)
Alkaline Phosphatase: 103 [IU]/L (ref 44–121)
BUN/Creatinine Ratio: 9 (ref 9–20)
BUN: 10 mg/dL (ref 6–20)
Bilirubin Total: 0.5 mg/dL (ref 0.0–1.2)
CO2: 23 mmol/L (ref 20–29)
Calcium: 9.8 mg/dL (ref 8.7–10.2)
Chloride: 103 mmol/L (ref 96–106)
Creatinine, Ser: 1.07 mg/dL (ref 0.76–1.27)
Globulin, Total: 2.6 g/dL (ref 1.5–4.5)
Glucose: 73 mg/dL (ref 70–99)
Potassium: 4.2 mmol/L (ref 3.5–5.2)
Sodium: 139 mmol/L (ref 134–144)
Total Protein: 7.4 g/dL (ref 6.0–8.5)
eGFR: 99 mL/min/{1.73_m2} (ref >59)

## 2023-03-10 LAB — HIV ANTIBODY (ROUTINE TESTING W REFLEX): HIV Screen 4th Generation wRfx: NONREACTIVE

## 2023-03-10 LAB — RPR: RPR Ser Ql: NONREACTIVE

## 2023-03-10 LAB — HSV 1 AND 2 AB, IGG
HSV 1 Glycoprotein G Ab, IgG: REACTIVE — AB
HSV 2 IgG, Type Spec: NONREACTIVE

## 2023-03-10 LAB — CHLAMYDIA ANTIBODIES, IGG: Chlamydia Antibodies, IgG: <0.91 {ratio} (ref 0.00–0.90)

## 2023-03-15 ENCOUNTER — Encounter (HOSPITAL_BASED_OUTPATIENT_CLINIC_OR_DEPARTMENT_OTHER): Payer: Self-pay | Admitting: Family Medicine

## 2023-03-30 ENCOUNTER — Other Ambulatory Visit (HOSPITAL_BASED_OUTPATIENT_CLINIC_OR_DEPARTMENT_OTHER): Payer: Self-pay | Admitting: Family Medicine

## 2023-04-02 ENCOUNTER — Other Ambulatory Visit: Payer: Self-pay | Admitting: Behavioral Health

## 2023-04-02 NOTE — Telephone Encounter (Signed)
Due 1/8

## 2023-04-03 ENCOUNTER — Other Ambulatory Visit (HOSPITAL_BASED_OUTPATIENT_CLINIC_OR_DEPARTMENT_OTHER): Payer: Self-pay

## 2023-04-03 ENCOUNTER — Other Ambulatory Visit: Payer: Self-pay

## 2023-04-03 ENCOUNTER — Other Ambulatory Visit (HOSPITAL_COMMUNITY): Payer: Self-pay

## 2023-04-03 NOTE — Progress Notes (Addendum)
 Specialty Pharmacy Refill Coordination Note  Damon Avila is a 23 y.o. male contacted today regarding refills of specialty medication(s) Emtricitabine -Tenofovir  DF (TRUVADA )   Patient requested [facility] at [facility] at [place] date: 04/04/23   Medication will be filled on 04/03/23.    04/20/23: Medications were filled for 13 days and were returned to stock. Was unable to reach patient; no voice mail.

## 2023-04-04 ENCOUNTER — Other Ambulatory Visit: Payer: Self-pay | Admitting: Behavioral Health

## 2023-04-04 ENCOUNTER — Other Ambulatory Visit (HOSPITAL_BASED_OUTPATIENT_CLINIC_OR_DEPARTMENT_OTHER): Payer: Self-pay

## 2023-04-04 NOTE — Telephone Encounter (Signed)
Due 1/8

## 2023-04-05 ENCOUNTER — Other Ambulatory Visit (HOSPITAL_BASED_OUTPATIENT_CLINIC_OR_DEPARTMENT_OTHER): Payer: Self-pay

## 2023-04-05 ENCOUNTER — Other Ambulatory Visit: Payer: Self-pay

## 2023-04-05 MED ORDER — LISDEXAMFETAMINE DIMESYLATE 20 MG PO CAPS
20.0000 mg | ORAL_CAPSULE | Freq: Every day | ORAL | 0 refills | Status: DC
  Filled 2023-04-05 – 2023-04-28 (×2): qty 30, 30d supply, fill #0

## 2023-04-06 ENCOUNTER — Other Ambulatory Visit (HOSPITAL_BASED_OUTPATIENT_CLINIC_OR_DEPARTMENT_OTHER): Payer: Self-pay

## 2023-04-17 ENCOUNTER — Other Ambulatory Visit (HOSPITAL_BASED_OUTPATIENT_CLINIC_OR_DEPARTMENT_OTHER): Payer: Self-pay

## 2023-04-20 ENCOUNTER — Other Ambulatory Visit: Payer: Self-pay

## 2023-04-24 ENCOUNTER — Other Ambulatory Visit: Payer: Self-pay

## 2023-04-26 ENCOUNTER — Other Ambulatory Visit: Payer: Self-pay

## 2023-04-28 ENCOUNTER — Other Ambulatory Visit (HOSPITAL_BASED_OUTPATIENT_CLINIC_OR_DEPARTMENT_OTHER): Payer: Self-pay

## 2023-05-01 ENCOUNTER — Ambulatory Visit: Payer: Self-pay | Admitting: Family Medicine

## 2023-05-01 ENCOUNTER — Other Ambulatory Visit (HOSPITAL_BASED_OUTPATIENT_CLINIC_OR_DEPARTMENT_OTHER): Payer: Self-pay

## 2023-05-01 ENCOUNTER — Encounter: Payer: Self-pay | Admitting: Behavioral Health

## 2023-05-01 ENCOUNTER — Ambulatory Visit: Payer: Managed Care, Other (non HMO) | Admitting: Family Medicine

## 2023-05-01 ENCOUNTER — Encounter: Payer: Self-pay | Admitting: Family Medicine

## 2023-05-01 ENCOUNTER — Ambulatory Visit (INDEPENDENT_AMBULATORY_CARE_PROVIDER_SITE_OTHER): Payer: 59 | Admitting: Behavioral Health

## 2023-05-01 DIAGNOSIS — F411 Generalized anxiety disorder: Secondary | ICD-10-CM

## 2023-05-01 DIAGNOSIS — F331 Major depressive disorder, recurrent, moderate: Secondary | ICD-10-CM | POA: Diagnosis not present

## 2023-05-01 DIAGNOSIS — F39 Unspecified mood [affective] disorder: Secondary | ICD-10-CM

## 2023-05-01 MED ORDER — VILAZODONE HCL 40 MG PO TABS
40.0000 mg | ORAL_TABLET | Freq: Every day | ORAL | 2 refills | Status: DC
  Filled 2023-05-01 (×2): qty 30, 30d supply, fill #0
  Filled 2023-06-03: qty 30, 30d supply, fill #1

## 2023-05-01 MED ORDER — MELOXICAM 15 MG PO TABS
15.0000 mg | ORAL_TABLET | Freq: Every day | ORAL | 0 refills | Status: DC
  Filled 2023-05-01: qty 30, 30d supply, fill #0

## 2023-05-01 MED ORDER — BUPROPION HCL ER (XL) 150 MG PO TB24
150.0000 mg | ORAL_TABLET | Freq: Every day | ORAL | 1 refills | Status: DC
  Filled 2023-05-01 (×2): qty 30, 30d supply, fill #0
  Filled 2023-06-03: qty 30, 30d supply, fill #1

## 2023-05-01 MED ORDER — LAMOTRIGINE 150 MG PO TABS
150.0000 mg | ORAL_TABLET | Freq: Every day | ORAL | 2 refills | Status: DC
  Filled 2023-05-01 (×2): qty 30, 30d supply, fill #0
  Filled 2023-06-03: qty 30, 30d supply, fill #1

## 2023-05-01 NOTE — Telephone Encounter (Signed)
Reason for Disposition . Large swelling or bruise (> 2 inches or 5 cm)  Protocols used: Knee Injury-A-AH

## 2023-05-01 NOTE — Telephone Encounter (Addendum)
  Chief Complaint: L knee pain, L hip pain Symptoms: pain, swelling Frequency:  2 days Pertinent Negatives: Patient denies discoloration Disposition: [] ED /[] Urgent Care (no appt availability in office) / [x] Appointment(In office/virtual)/ []  [place] [facility]/ [] Home Care/ [] Refused Recommended Disposition /[] [place] [facility]/ []  Follow-up with PCP Additional Notes: Pt states he was skiing this weekend when he fell. Pt states that he is now suffering from L hip/knee pain. Pt denies hearing a pop when he fell. Also states that he could not move the knee for a bit after the fall.  Currently pt states that without a knee brace his pain is 10, with the brace the pt states that his pain is more manageable. Pt states that he cannot put weight on the knee. + swelling but denies discoloration. Pt denies prior knee surgery to the injured area but recalls some instability and needing braces in the past. No appts at pt pcp office.   Copied from CRM [id number]. Topic: Clinical - Red Word Triage >> May 01, 2023 10:23 AM Dennison Nancy wrote: Red Word that prompted transfer to Nurse Triage: left swelling  knee and hip , hard to  bear pressure with  pain was skiing Answer Assessment - Initial Assessment Questions 1. MECHANISM: "How did the injury happen?" (e.g., twisting injury, direct blow)      Fell during skiing 2. ONSET: "When did the injury happen?" (Minutes or hours ago)      2 days ago 3. LOCATION: "Where is the injury located?"      L knee and hip 4. APPEARANCE of INJURY: "What does the injury look like?"      Swollen, denies discoloration 5. SEVERITY: "Can you put weight on that leg?" "Can you walk?"      Cannot put weight on knee 6. SIZE: For cuts, bruises, or swelling, ask: "How large is it?" (e.g., inches or centimeters;  entire joint)      denies 7. PAIN: "Is there pain?" If Yes, ask: "How bad is the pain?"  "What does it keep you from doing?" (e.g., Scale 1-10; or mild, moderate, severe)    -  NONE: (0): no pain   -  MILD (1-3): doesn't interfere with normal activities    -  MODERATE (4-7): interferes with normal activities (e.g., work or school) or awakens from sleep, limping    -  SEVERE (8-10): excruciating pain, unable to do any normal activities, unable to walk     4 with the brace, about a 10 without the brace/movement. 9. OTHER SYMPTOMS: "Do you have any other symptoms?"  (e.g., "pop" when knee injured, swelling, locking, buckling)      denies  Protocols used: Knee Injury-A-AH

## 2023-05-01 NOTE — Progress Notes (Signed)
[facility] Check  Patient ID: Damon Avila,  MRN: [id number]  PCP: Alyson Reedy, FNP  Date of Evaluation: 05/01/2023 Time spent:30 minutes  Chief Complaint:  Chief Complaint   Anxiety; Depression; Follow-up; Medication Refill; Patient Education; Medication Problem     HISTORY/CURRENT STATUS: HPI "Damon Avila", 23 year old male presents to this office for follow up and medication management. Collateral information should be considered reliable.  Patient is very pleasant, cooperative, and articulate. Feels like his medication was working well but then lost some of its effect over the last few weeks. Says he feels like his depression is starting to creep back in. He is open to medication adjustment this visit.  Reports anxiety at 3/10 and depression at 5/10.  Sleeping 7-8 hours per night.   He denies any current mania today, no psychosis, no history of auditory or visual hallucinations.  No SI or HI.  He does have 1 prior hospitalization in 2015 for major depressive disorder.  Past psychiatric medication trials: Zoloft Adderall Ritalin Vyvanse   Individual Medical History/ Review of Systems: Changes? :No   Allergies: Patient has no known allergies.  Current Medications:  Current Outpatient Medications:    buPROPion (WELLBUTRIN XL) 150 MG 24 hr tablet, Take 1 tablet (150 mg total) by mouth daily., Disp: 30 tablet, Rfl: 1   cetirizine (ZYRTEC) 10 MG tablet, Take 10 mg by mouth 2 (two) times daily., Disp: , Rfl:    emtricitabine-tenofovir (TRUVADA) 200-300 MG tablet, Take 1 tablet by mouth daily., Disp: 30 tablet, Rfl: 2   lamoTRIgine (LAMICTAL) 150 MG tablet, Take 1 tablet (150 mg total) by mouth daily., Disp: 30 tablet, Rfl: 2   lisdexamfetamine (VYVANSE) 20 MG capsule, Take 1 capsule (20 mg total) by mouth daily., Disp: 30 capsule, Rfl: 0   LORazepam (ATIVAN) 0.5 MG tablet, Take 1 tablet (0.5 mg total) by mouth every 8 (eight) hours., Disp: 20 tablet, Rfl: 1   methocarbamol  (ROBAXIN-750) 750 MG tablet, Take 2 tablets (1,500 mg total) by mouth 3 (three) times daily as needed for muscle spasms., Disp: 30 tablet, Rfl: 0   Vilazodone HCl (VIIBRYD) 40 MG TABS, Take 1 tablet (40 mg total) by mouth daily., Disp: 30 tablet, Rfl: 2 Medication Side Effects: none  Family Medical/ Social History: Changes? No  MENTAL HEALTH EXAM:  There were no vitals taken for this visit.There is no height or weight on file to calculate BMI.  General Appearance: Casual  Eye Contact:  Good  Speech:  Clear and Coherent  Volume:  Normal  Mood:  Depressed and Dysphoric  Affect:  Appropriate and Depressed  Thought Process:  Coherent  Orientation:  Full (Time, Place, and Person)  Thought Content: Logical   Suicidal Thoughts:  No  Homicidal Thoughts:  No  Memory:  WNL  Judgement:  Good  Insight:  Good  Psychomotor Activity:  Normal  Concentration:  Concentration: Good  Recall:  Good  Fund of Knowledge: Good  Language: Good  Assets:  Desire for Improvement  ADL's:  Intact  Cognition: WNL  Prognosis:  Good    DIAGNOSES:    ICD-10-CM   1. Major depressive disorder, recurrent episode, moderate (HCC)  F33.1 buPROPion (WELLBUTRIN XL) 150 MG 24 hr tablet    lamoTRIgine (LAMICTAL) 150 MG tablet    Vilazodone HCl (VIIBRYD) 40 MG TABS    2. Generalized anxiety disorder  F41.1 buPROPion (WELLBUTRIN XL) 150 MG 24 hr tablet    Vilazodone HCl (VIIBRYD) 40 MG TABS    3. Unspecified  mood (affective) disorder (HCC)  F39 buPROPion (WELLBUTRIN XL) 150 MG 24 hr tablet    lamoTRIgine (LAMICTAL) 150 MG tablet    Vilazodone HCl (VIIBRYD) 40 MG TABS      Receiving Psychotherapy: No    RECOMMENDATIONS:   Greater than 50% of 30 min face to face time with patient was spent on counseling and coordination of care. Discussed worsening symptoms of depression since last visit. Encouraged pt to  exercise more, go for walks outside, increase uv exposure on nice days.  Reviewed medications and talked  about options.  We talked about medication options.    Reconciled medications.   We agreed today to: Continue Viibryd to 40 mg daily To start Wellbutrin 150 mg XL daily Continue Lamictal to 150 mg daily Continue Ativan,  0.5 mg daily as needed only for severe anixety Will follow-up in 6   weeks to reassess Provided emergency contact information To report side effects or worsening symptoms promptly Monitor for any sign of rash. Please taking Lamictal and contact office immediately rash develops. Recommend seeking urgent medical attention if rash is severe and/or spreading quickly.  Reviewed PDMP   Joan Flores, NP

## 2023-05-01 NOTE — Telephone Encounter (Signed)
Pt has an appt today at 1:20 with another family medicine office to have his knee looked at.

## 2023-05-02 ENCOUNTER — Other Ambulatory Visit: Payer: Self-pay

## 2023-05-05 ENCOUNTER — Other Ambulatory Visit (HOSPITAL_BASED_OUTPATIENT_CLINIC_OR_DEPARTMENT_OTHER): Payer: Self-pay

## 2023-05-05 ENCOUNTER — Other Ambulatory Visit: Payer: Self-pay

## 2023-05-05 MED ORDER — TRAMADOL HCL 50 MG PO TABS
50.0000 mg | ORAL_TABLET | Freq: Four times a day (QID) | ORAL | 0 refills | Status: DC | PRN
  Filled 2023-05-05: qty 15, 4d supply, fill #0

## 2023-05-05 NOTE — Progress Notes (Signed)
 Disenrolling - unable to reach patient to confirm if they would like us  to continue to fill medication. Truvada  last filled 10.31.24.

## 2023-05-26 NOTE — Telephone Encounter (Signed)
 Message taken care of

## 2023-06-03 ENCOUNTER — Other Ambulatory Visit (HOSPITAL_BASED_OUTPATIENT_CLINIC_OR_DEPARTMENT_OTHER): Payer: Self-pay

## 2023-06-03 ENCOUNTER — Other Ambulatory Visit: Payer: Self-pay | Admitting: Behavioral Health

## 2023-06-03 DIAGNOSIS — F411 Generalized anxiety disorder: Secondary | ICD-10-CM

## 2023-06-05 ENCOUNTER — Other Ambulatory Visit (HOSPITAL_BASED_OUTPATIENT_CLINIC_OR_DEPARTMENT_OTHER): Payer: Self-pay

## 2023-06-05 MED ORDER — LORAZEPAM 0.5 MG PO TABS
0.5000 mg | ORAL_TABLET | Freq: Three times a day (TID) | ORAL | 1 refills | Status: AC
  Filled 2023-06-05: qty 20, 7d supply, fill #0

## 2023-06-05 MED ORDER — LISDEXAMFETAMINE DIMESYLATE 20 MG PO CAPS
20.0000 mg | ORAL_CAPSULE | Freq: Every day | ORAL | 0 refills | Status: DC
  Filled 2023-06-05: qty 30, 30d supply, fill #0

## 2023-06-06 ENCOUNTER — Ambulatory Visit (HOSPITAL_BASED_OUTPATIENT_CLINIC_OR_DEPARTMENT_OTHER): Payer: Managed Care, Other (non HMO) | Admitting: Family Medicine

## 2023-06-06 ENCOUNTER — Encounter (HOSPITAL_BASED_OUTPATIENT_CLINIC_OR_DEPARTMENT_OTHER): Payer: Self-pay | Admitting: Family Medicine

## 2023-06-06 ENCOUNTER — Other Ambulatory Visit (HOSPITAL_COMMUNITY)
Admission: RE | Admit: 2023-06-06 | Discharge: 2023-06-06 | Disposition: A | Discharge status: Home / Self Care | Source: Ambulatory Visit | Attending: Family Medicine | Admitting: Family Medicine

## 2023-06-06 VITALS — BP 111/69 | HR 78 | Ht 68.0 in | Wt 162.0 lb

## 2023-06-06 DIAGNOSIS — Z202 Contact with and (suspected) exposure to infections with a predominantly sexual mode of transmission: Secondary | ICD-10-CM | POA: Insufficient documentation

## 2023-06-06 DIAGNOSIS — Z7252 High risk homosexual behavior: Secondary | ICD-10-CM

## 2023-06-06 DIAGNOSIS — Z0000 Encounter for general adult medical examination without abnormal findings: Secondary | ICD-10-CM | POA: Diagnosis not present

## 2023-06-06 DIAGNOSIS — Z13220 Encounter for screening for lipoid disorders: Secondary | ICD-10-CM

## 2023-06-06 DIAGNOSIS — S82202A Unspecified fracture of shaft of left tibia, initial encounter for closed fracture: Secondary | ICD-10-CM | POA: Insufficient documentation

## 2023-06-06 DIAGNOSIS — Z1159 Encounter for screening for other viral diseases: Secondary | ICD-10-CM

## 2023-06-06 DIAGNOSIS — Z114 Encounter for screening for human immunodeficiency virus [HIV]: Secondary | ICD-10-CM

## 2023-06-06 DIAGNOSIS — S82202S Unspecified fracture of shaft of left tibia, sequela: Secondary | ICD-10-CM | POA: Diagnosis not present

## 2023-06-06 NOTE — Patient Instructions (Signed)

## 2023-06-06 NOTE — Progress Notes (Signed)
 Subjective:   CREWS MCCOLLAM [birth year] 06/06/2023  CC: Chief Complaint  Patient presents with   Annual Exam    Patient is here today for his physical. Denies any concerns for today.    HPI: KASE SHUGHART is a 23 y.o. male who presents for a routine health maintenance exam.  Labs collected at time of visit.   Patient reports recent nondisplaced tibial plateau fx, popliteal cyst recently due to skiing accident. He is managed by [facility] and is non weight bearing.    HIV PREP:  Patient was previously on Truvada for PREP for high risk sexual behavior. Patient was previously taking Truvada and desired to get injectable medication through infectious disease. He was unable to establish due to schedule issues and stopped taking Truvada due to not being sexually active. He would like to be referred to ID to discuss injectable PREP options.    HEALTH SCREENINGS: - Vision Screening:  wears glasses, contacts; recommended - Dental Visits: up to date - Testicular Exam: Declined - STD Screening: Ordered today - PSA (50+): Not applicable  No results found for: "PSA1", "PSA"   - Colonoscopy (45+): Not applicable  Discussed with patient purpose of the colonoscopy is to detect colon cancer at curable precancerous or early stages  - AAA Screening: Not applicable  Men age 44-75 who have ever smoked - Lung Cancer screening with low-dose CT: Not applicable-  Adults age 66-80 who are current cigarette smokers or quit within the last 15 years. Must have 20 pack year history.   Depression and Anxiety Screen done today and results listed below:     06/06/2023    1:13 PM 11/03/2022    1:50 PM 10/21/2022   10:04 AM 09/22/2022    1:50 PM 07/08/2022    9:27 AM  Depression screen PHQ 2/9  Decreased Interest 0  0 1 0  Down, Depressed, Hopeless 0  0 0 0  PHQ - 2 Score 0  0 1 0  Altered sleeping 3  1 1    Tired, decreased energy 2  2 1    Change in appetite 1  0 2   Feeling bad or failure about  yourself  0  0 0   Trouble concentrating 1  1 0   Moving slowly or fidgety/restless 1  1 0   Suicidal thoughts 0  0 0   PHQ-9 Score 8  5 5    Difficult doing work/chores Not difficult at all  Somewhat difficult Not difficult at all      Information is confidential and restricted. Go to Review Flowsheets to unlock data.      06/06/2023    1:14 PM 10/21/2022   10:05 AM 09/22/2022    1:42 PM 07/08/2022    9:28 AM  GAD 7 : Generalized Anxiety Score  Nervous, Anxious, on Edge 1 1 2  0  Control/stop worrying 0 0 0 0  Worry too much - different things 1 2 2  0  Trouble relaxing 1 2 1  0  Restless 1 1 1  0  Easily annoyed or irritable 0 1 2 0  Afraid - awful might happen 0 0 0 0  Total GAD 7 Score 4 7 8  0  Anxiety Difficulty Not difficult at all Somewhat difficult Not difficult at all Not difficult at all    IMMUNIZATIONS:  - Tdap: Tetanus vaccination status reviewed: last tetanus booster within 10 years. - Influenza: Up to date - Pneumovax: Not applicable - Prevnar: Not applicable - Shingrix  vaccine (50+): Not applicable - HPV Vaccines: Up to Date    Past medical history, surgical history, medications, allergies, family history and social history reviewed with patient today and changes made to appropriate areas of the chart.   Past Medical History:  Diagnosis Date   ADHD (attention deficit hyperactivity disorder), inattentive type    Allergy    Anxiety    a. Anxiety episode freshman year in high school.   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 04/2017 Echo [facility]): nl LV/RV fxn, structurally nl heart; b. 05/2017 Event monitor (2 wks): Avg rate 82 bpm, Min HR 47, max HR 162. Predominantly sinus rhythm. Isolated SVE's/couplets (rare - <1%).    Past Surgical History:  Procedure Laterality Date   TONSILLECTOMY      Current Outpatient Medications on File  Prior to Visit  Medication Sig   buPROPion (WELLBUTRIN XL) 150 MG 24 hr tablet Take 1 tablet (150 mg total) by mouth daily.   cetirizine (ZYRTEC) 10 MG tablet Take 10 mg by mouth 2 (two) times daily.   emtricitabine-tenofovir (TRUVADA) 200-300 MG tablet Take 1 tablet by mouth daily.   lamoTRIgine (LAMICTAL) 150 MG tablet Take 1 tablet (150 mg total) by mouth daily.   lisdexamfetamine (VYVANSE) 20 MG capsule Take 1 capsule (20 mg total) by mouth daily.   LORazepam (ATIVAN) 0.5 MG tablet Take 1 tablet (0.5 mg total) by mouth every 8 (eight) hours.   Vilazodone HCl (VIIBRYD) 40 MG TABS Take 1 tablet (40 mg total) by mouth daily.   [DISCONTINUED] metoprolol succinate (TOPROL-XL) 25 MG 24 hr tablet Take 1 tablet (25 mg total) by mouth daily.   No current facility-administered medications on file prior to visit.    No Known Allergies   Social History   Socioeconomic History   Marital status: Single    Spouse name: Shawn   Number of children: Not on file   Years of education: 14   Highest education level: Bachelor's degree (e.g., BA, AB, BS)  Occupational History   Occupation: Parademic    Employer: [employer]  Tobacco Use   Smoking status: Never   Smokeless tobacco: Never  Vaping Use   Vaping status: Every Day   Substances: Nicotine  Substance and Sexual Activity   Alcohol use: Yes    Alcohol/week: 2.0 - 3.0 standard drinks of alcohol    Types: 2 - 3 Glasses of wine per week    Comment: light social   Drug use: No   Sexual activity: Yes  Other Topics Concern   Not on file  Social History Narrative   Lives in [place] in single dwelling home with significant other. Enjoys reading, tv shows in free time.    Social Drivers of Corporate investment banker Strain: Not on file  Food Insecurity: Not on file  Transportation Needs: Not on file  Physical Activity: Not on file  Stress: Not on file  Social Connections: Not on file  Intimate Partner Violence: Not on file    Social History   Tobacco Use  Smoking Status Never  Smokeless Tobacco Never   Social History   Substance and Sexual Activity  Alcohol Use Yes   Alcohol/week: 2.0 - 3.0 standard drinks of alcohol   Types: 2 - 3 Glasses of wine per week   Comment: light social  Family History  Problem Relation Age of Onset   OCD Mother    Anxiety disorder Mother    Bipolar disorder Mother    Depression Mother    Diabetes Mother        Type 2   Drug abuse Father    Bipolar disorder Father    Depression Father    Depression Maternal Uncle    Anxiety disorder Maternal Uncle    Bipolar disorder Maternal Uncle    Cancer Maternal Grandfather    Depression Maternal Grandmother    Anxiety disorder Maternal Grandmother    COPD Maternal Grandmother    Bipolar disorder Maternal Grandmother    Cancer Paternal Grandmother      ROS: Denies fever, fatigue, unexplained weight loss/gain, CP, SHOB, and palpatitations. Denies neurological deficits, gastrointestinal and/or genitourinary complaints, and skin changes.   Objective:   Today's Vitals   06/06/23 1309  BP: 111/69  Pulse: 78  SpO2: 99%  Weight: 162 lb (73.5 kg)  Height: 5\' 8"  (1.727 m)    GENERAL APPEARANCE: Well-appearing, in NAD. Well nourished.  SKIN: Pink, warm and dry. Turgor normal. No rash, lesion, ulceration, or ecchymoses. Hair evenly distributed.  HEENT: HEAD: Normocephalic.  EYES: PERRLA. EOMI. Lids intact w/o defect. Sclera white, Conjunctiva pink w/o exudate.  EARS: External ear w/o redness, swelling, masses or lesions. EAC clear. TM's intact, translucent w/o bulging, appropriate landmarks visualized. Appropriate acuity to conversational tones.  NOSE: Septum midline w/o deformity. Nares patent, mucosa pink and non-inflamed w/o drainage. No sinus tenderness.  THROAT: Uvula midline. Oropharynx clear.  Oral mucosa pink and moist.  NECK: Supple, Trachea midline. Full ROM w/o pain or tenderness. No lymphadenopathy.  Thyroid non-tender w/o enlargement or palpable masses.  RESPIRATORY: Chest wall symmetrical w/o masses. Respirations even and non-labored. Breath sounds clear to auscultation bilaterally. No wheezes, rales, rhonchi, or crackles. CARDIAC: S1, S2 present, regular rate and rhythm. No gallops, murmurs, rubs, or clicks. PMI w/o lifts, heaves, or thrills. No carotid bruits. Capillary refill <2 seconds. Peripheral pulses 2+ bilaterally. GI: Abdomen soft w/o distention. Normoactive bowel sounds. No palpable masses or tenderness. No guarding or rebound tenderness. Liver and spleen w/o tenderness or enlargement. No CVA tenderness.  GU: Pt deferred exam. MSK: Muscle tone and strength appropriate for age, w/o atrophy or abnormal movement. EXTREMITIES: Active ROM intact, w/o tenderness, crepitus, or contracture. No clubbing, edema, or cyanosis. LLE in full knee immobilizer. Pedal pulses intact bilaterally. Patient using crutches to ambulate.  NEUROLOGIC: CN's II-XII intact. Motor strength symmetrical with no obvious weakness. No sensory deficits. DTR 2+ symmetric bilaterally. Steady, even gait.  PSYCH/MENTAL STATUS: Alert, oriented x 3. Cooperative, appropriate mood and affect.     Assessment & Plan:  1. Annual physical exam (Primary) Discussed preventative screenings, vaccines, and healthy lifestyle. Will obtain CBC, CMP with AE today while fasting.  - CBC with Differential/Platelet - Comprehensive metabolic panel  2. High risk homosexual behavior Referral placed to ID to discuss PrEP options. Pt declined to restart Truvada today as he is not sexually active. Safe practices reviewed.  - Ambulatory referral to Infectious Disease  3. Screening, lipid Previous hx of slight LDL elevation managed with diet and exercise. Will screen with fasting labs today.  - Lipid panel  4. Encounter for assessment of sexually transmitted disease exposure Asymptomatic. No recent known exposure. Testing per patient request  completed.  - RPR - Urine cytology ancillary only  5. Encounter for screening for HIV Asymptomatic. No recent known exposure. Testing per patient  request completed.  - HIV Antibody (routine testing w rflx)  6. Encounter for hepatitis C screening test for low risk patient Asymptomatic. No recent known exposure. Testing per patient request completed.  - Hepatitis C antibody  7. Closed fracture of shaft of left tibia, unspecified fracture morphology, sequela Currently managed with conservative treatment by Orthopedics. Pt to continue follow up per Ortho.    Orders Placed This Encounter  Procedures   CBC with Differential/Platelet   Comprehensive metabolic panel   Lipid panel   HIV Antibody (routine testing w rflx)   Hepatitis C antibody   RPR   Ambulatory referral to Infectious Disease    Referral Priority:   Routine    Referral Type:   Consultation    Referral Reason:   Specialty Services Required    Requested Specialty:   Infectious Diseases    Number of Visits Requested:   1    PATIENT COUNSELING: - Encouraged to adjust caloric intake to maintain or achieve ideal body weight, to reduce intake of dietary saturated fat and total fat, to limit sodium intake by avoiding high sodium foods and not adding table salt, and to maintain adequate dietary potassium and calcium preferably from fresh fruits, vegetables, and low-fat dairy products.   - Advised to avoid cigarette smoking. - Discussed with the patient that most people either abstain from alcohol or drink within safe limits (<=14/week and <=4 drinks/occasion for males, <=7/weeks and <= 3 drinks/occasion for females) and that the risk for alcohol disorders and other health effects rises proportionally with the number of drinks per week and how often a drinker exceeds daily limits. - Discussed cessation/primary prevention of drug use and availability of treatment for abuse.   - Stressed the importance of regular exercise - Injury  prevention: Discussed safety belts, safety helmets, smoke detector, smoking near bedding or upholstery.  - Dental health: Discussed importance of regular tooth brushing, flossing, and dental visits.  - Sexuality: Discussed sexually transmitted diseases, partner selection, use of condoms, avoidance of unintended pregnancy  and contraceptive alternatives.   NEXT PREVENTATIVE PHYSICAL DUE IN 1 YEAR.  Return for Every 3 months lab visit per patient; 1 year AE.  Patient to reach out to office if new, worrisome, or unresolved symptoms arise or if no improvement in patient's condition. Patient verbalized understanding and is agreeable to treatment plan. All questions answered to patient's satisfaction.    Hilbert Bible, [place]

## 2023-06-07 ENCOUNTER — Encounter (HOSPITAL_BASED_OUTPATIENT_CLINIC_OR_DEPARTMENT_OTHER): Payer: Self-pay | Admitting: Family Medicine

## 2023-06-07 ENCOUNTER — Telehealth: Payer: Self-pay

## 2023-06-07 ENCOUNTER — Other Ambulatory Visit (HOSPITAL_COMMUNITY): Payer: Self-pay

## 2023-06-07 LAB — RPR: RPR Ser Ql: NONREACTIVE

## 2023-06-07 LAB — CBC WITH DIFFERENTIAL/PLATELET
Basophils Absolute: 0 10*3/uL (ref 0.0–0.2)
Basos: 0 %
EOS (ABSOLUTE): 0.2 10*3/uL (ref 0.0–0.4)
Eos: 3 %
Hematocrit: 41.9 % (ref 37.5–51.0)
Hemoglobin: 14.2 g/dL (ref 13.0–17.7)
Immature Grans (Abs): 0 10*3/uL (ref 0.0–0.1)
Immature Granulocytes: 0 %
Lymphocytes Absolute: 2.2 10*3/uL (ref 0.7–3.1)
Lymphs: 33 %
MCH: 30.4 pg (ref 26.6–33.0)
MCHC: 33.9 g/dL (ref 31.5–35.7)
MCV: 90 fL (ref 79–97)
Monocytes Absolute: 0.7 10*3/uL (ref 0.1–0.9)
Monocytes: 10 %
Neutrophils Absolute: 3.5 10*3/uL (ref 1.4–7.0)
Neutrophils: 54 %
Platelets: 255 10*3/uL (ref 150–450)
RBC: 4.67 x10E6/uL (ref 4.14–5.80)
RDW: 12.3 % (ref 11.6–15.4)
WBC: 6.6 10*3/uL (ref 3.4–10.8)

## 2023-06-07 LAB — URINE CYTOLOGY ANCILLARY ONLY
Chlamydia: NEGATIVE
Comment: NEGATIVE
Comment: NORMAL
Neisseria Gonorrhea: NEGATIVE

## 2023-06-07 LAB — COMPREHENSIVE METABOLIC PANEL
ALT: 21 IU/L (ref 0–44)
AST: 17 IU/L (ref 0–40)
Albumin: 4.8 g/dL (ref 4.3–5.2)
Alkaline Phosphatase: 98 IU/L (ref 44–121)
BUN/Creatinine Ratio: 11 (ref 9–20)
BUN: 11 mg/dL (ref 6–20)
Bilirubin Total: 0.6 mg/dL (ref 0.0–1.2)
CO2: 22 mmol/L (ref 20–29)
Calcium: 9.4 mg/dL (ref 8.7–10.2)
Chloride: 103 mmol/L (ref 96–106)
Creatinine, Ser: 1.02 mg/dL (ref 0.76–1.27)
Globulin, Total: 2.3 g/dL (ref 1.5–4.5)
Glucose: 83 mg/dL (ref 70–99)
Potassium: 4.1 mmol/L (ref 3.5–5.2)
Sodium: 141 mmol/L (ref 134–144)
Total Protein: 7.1 g/dL (ref 6.0–8.5)
eGFR: 105 mL/min/{1.73_m2} (ref >59)

## 2023-06-07 LAB — HEPATITIS C ANTIBODY: Hep C Virus Ab: NONREACTIVE

## 2023-06-07 LAB — LIPID PANEL
Chol/HDL Ratio: 5 ratio (ref 0.0–5.0)
Cholesterol, Total: 181 mg/dL (ref 100–199)
HDL: 36 mg/dL — ABNORMAL LOW (ref >39)
LDL Chol Calc (NIH): 125 mg/dL — ABNORMAL HIGH (ref 0–99)
Triglycerides: 110 mg/dL (ref 0–149)
VLDL Cholesterol Cal: 20 mg/dL (ref 5–40)

## 2023-06-07 LAB — HIV ANTIBODY (ROUTINE TESTING W REFLEX): HIV Screen 4th Generation wRfx: NONREACTIVE

## 2023-06-07 NOTE — Telephone Encounter (Signed)
 Pharmacy Patient Advocate Encounter  Insurance verification completed.   The patient is insured through  [employer]    Ran test claim for Descovy. Currently a quantity of 30 is a 30 day supply and Patient will have to fill with [facility] # [phone] .   This test claim was processed through [facility]- copay amounts may vary at other pharmacies due to pharmacy/plan contracts, or as the patient moves through the different stages of their insurance plan.

## 2023-06-07 NOTE — Progress Notes (Signed)
 Hi Damon Avila, Your cholesterol has gone up slightly from 6 months ago.  It is not to the point where medication such as a statin is needed, but dietary changes such as a heart healthy diet, increasing diet and omega-3 rich foods would help.  An omega-3 fish oil supplement would also help.  Your blood counts and CMP were normal.  Your testing for hep C, HIV, RPR were negative.  I recommend we keep an eye on your cholesterol least yearly with your annual exam.

## 2023-06-12 ENCOUNTER — Other Ambulatory Visit (HOSPITAL_BASED_OUTPATIENT_CLINIC_OR_DEPARTMENT_OTHER): Payer: Self-pay

## 2023-06-12 ENCOUNTER — Ambulatory Visit (INDEPENDENT_AMBULATORY_CARE_PROVIDER_SITE_OTHER): Payer: 59 | Admitting: Behavioral Health

## 2023-06-12 ENCOUNTER — Encounter: Payer: Self-pay | Admitting: Behavioral Health

## 2023-06-12 ENCOUNTER — Other Ambulatory Visit (HOSPITAL_COMMUNITY): Payer: Self-pay

## 2023-06-12 DIAGNOSIS — F39 Unspecified mood [affective] disorder: Secondary | ICD-10-CM

## 2023-06-12 DIAGNOSIS — F411 Generalized anxiety disorder: Secondary | ICD-10-CM | POA: Diagnosis not present

## 2023-06-12 DIAGNOSIS — F902 Attention-deficit hyperactivity disorder, combined type: Secondary | ICD-10-CM

## 2023-06-12 DIAGNOSIS — F331 Major depressive disorder, recurrent, moderate: Secondary | ICD-10-CM

## 2023-06-12 MED ORDER — VILAZODONE HCL 40 MG PO TABS
40.0000 mg | ORAL_TABLET | Freq: Every day | ORAL | 1 refills | Status: DC
  Filled 2023-06-12 – 2023-07-05 (×2): qty 90, 90d supply, fill #0

## 2023-06-12 MED ORDER — LAMOTRIGINE 150 MG PO TABS
150.0000 mg | ORAL_TABLET | Freq: Every day | ORAL | 1 refills | Status: DC
  Filled 2023-06-12 – 2023-07-05 (×2): qty 90, 90d supply, fill #0

## 2023-06-12 MED ORDER — LISDEXAMFETAMINE DIMESYLATE 20 MG PO CAPS
20.0000 mg | ORAL_CAPSULE | Freq: Every day | ORAL | 0 refills | Status: DC
  Filled 2023-06-12 – 2023-07-05 (×2): qty 30, 30d supply, fill #0

## 2023-06-12 MED ORDER — BUPROPION HCL ER (XL) 150 MG PO TB24
150.0000 mg | ORAL_TABLET | Freq: Every day | ORAL | 1 refills | Status: DC
  Filled 2023-06-12 – 2023-07-05 (×2): qty 90, 90d supply, fill #0

## 2023-06-12 NOTE — Progress Notes (Signed)
 [facility]  Patient ID: Damon Avila,  MRN: [id number]  PCP: Damon Bible, FNP  Date of Evaluation: 06/12/2023 Time spent:30 minutes  Chief Complaint:  Chief Complaint   Depression; ADHD; Anxiety; Follow-up; Medication Refill; Patient Education; Stress     HISTORY/CURRENT STATUS: HPI  "Damon Avila", 23 year old male presents to this office for follow up and medication management. Collateral information should be considered reliable.  Patient is very pleasant, cooperative, and articulate. Feels like his medication was working well. With the weather becoming warmer, he is feeling much better.   Requesting no medication change. No medication changes this visit.  Reports anxiety at 3/10 and depression at 3/10.  Sleeping 7-8 hours per night.   He denies any current mania today, no psychosis, no history of auditory or visual hallucinations.  No SI or HI.  He does have 1 prior hospitalization in 2015 for major depressive disorder.  Past psychiatric medication trials: Zoloft Adderall Ritalin Vyvanse    Individual Medical History/ Review of Systems: Changes? :No   Allergies: Patient has no known allergies.  Current Medications:  Current Outpatient Medications:    buPROPion (WELLBUTRIN XL) 150 MG 24 hr tablet, Take 1 tablet (150 mg total) by mouth daily., Disp: 30 tablet, Rfl: 1   cetirizine (ZYRTEC) 10 MG tablet, Take 10 mg by mouth 2 (two) times daily., Disp: , Rfl:    emtricitabine-tenofovir (TRUVADA) 200-300 MG tablet, Take 1 tablet by mouth daily., Disp: 30 tablet, Rfl: 2   lamoTRIgine (LAMICTAL) 150 MG tablet, Take 1 tablet (150 mg total) by mouth daily., Disp: 30 tablet, Rfl: 2   lisdexamfetamine (VYVANSE) 20 MG capsule, Take 1 capsule (20 mg total) by mouth daily., Disp: 30 capsule, Rfl: 0   LORazepam (ATIVAN) 0.5 MG tablet, Take 1 tablet (0.5 mg total) by mouth every 8 (eight) hours., Disp: 20 tablet, Rfl: 1   Vilazodone HCl (VIIBRYD) 40 MG TABS, Take 1  tablet (40 mg total) by mouth daily., Disp: 30 tablet, Rfl: 2 Medication Side Effects: none  Family Medical/ Social History: Changes? No  MENTAL HEALTH EXAM:  There were no vitals taken for this visit.There is no height or weight on file to calculate BMI.  General Appearance: Casual, Neat, and Well Groomed  Eye Contact:  Good  Speech:  Clear and Coherent  Volume:  Normal  Mood:  Negative  Affect:  Appropriate  Thought Process:  Coherent  Orientation:  Full (Time, Place, and Person)  Thought Content: Logical   Suicidal Thoughts:  No  Homicidal Thoughts:  No  Memory:  WNL  Judgement:  Good  Insight:  Good  Psychomotor Activity:  Normal  Concentration:  Concentration: Good  Recall:  Good  Fund of Knowledge: Good  Language: Good  Assets:  Desire for Improvement  ADL's:  Intact  Cognition: WNL  Prognosis:  Good    DIAGNOSES:    ICD-10-CM   1. Attention deficit hyperactivity disorder (ADHD), combined type  F90.2     2. Major depressive disorder, recurrent episode, moderate (HCC)  F33.1     3. Generalized anxiety disorder  F41.1     4. Unspecified mood (affective) disorder (HCC)  F39       Receiving Psychotherapy: No     RECOMMENDATIONS:   Greater than 50% of 30 min face to face time with patient was spent on counseling and coordination of care. No new social changes. Discussed his continued improvement with anxiety and depression. Encouraged pt to  exercise more, go for walks  outside, increase uv exposure on nice days.  Requesting no med changes.      We agreed today to:  Continue Viibryd to 40 mg daily To start Wellbutrin 150 mg XL daily Continue Lamictal to 150 mg daily Continue Ativan,  0.5 mg daily as needed only for severe anixety Continue Vyvanse 20 mg daily in the am after breakfast. Will follow-up in 12 weeks to reassess Provided emergency contact information To report side effects or worsening symptoms promptly Monitor for any sign of rash. Please  taking Lamictal and contact office immediately rash develops. Recommend seeking urgent medical attention if rash is severe and/or spreading quickly.  Reviewed PDMP   Joan Flores, NP

## 2023-06-12 NOTE — Progress Notes (Unsigned)
 NEW REFERRAL TO CPP CLINIC    Date:  06/12/2023   HPI: Damon Avila is a 23 y.o. male who presents to the [facility] pharmacy clinic to discuss and initiate PrEP.  Insured   [x]    Uninsured  []    Patient Active Problem List   Diagnosis Date Noted   Closed fracture of shaft of left tibia 06/06/2023   High risk homosexual behavior 12/06/2022   Mood disorder (HCC) 09/23/2022   Seasonal allergies 05/18/2021   History of COVID-19 05/07/2020   Attention deficit hyperactivity disorder (ADHD), combined type 06/26/2013   GAD (generalized anxiety disorder) 06/26/2013   MDD (major depressive disorder), single episode, severe (HCC) 06/25/2013    Patient's Medications  New Prescriptions   No medications on file  Previous Medications   BUPROPION (WELLBUTRIN XL) 150 MG 24 HR TABLET    Take 1 tablet (150 mg total) by mouth daily.   CETIRIZINE (ZYRTEC) 10 MG TABLET    Take 10 mg by mouth 2 (two) times daily.   EMTRICITABINE-TENOFOVIR (TRUVADA) 200-300 MG TABLET    Take 1 tablet by mouth daily.   LAMOTRIGINE (LAMICTAL) 150 MG TABLET    Take 1 tablet (150 mg total) by mouth daily.   LISDEXAMFETAMINE (VYVANSE) 20 MG CAPSULE    Take 1 capsule (20 mg total) by mouth daily.   LORAZEPAM (ATIVAN) 0.5 MG TABLET    Take 1 tablet (0.5 mg total) by mouth every 8 (eight) hours.   VILAZODONE HCL (VIIBRYD) 40 MG TABS    Take 1 tablet (40 mg total) by mouth daily.  Modified Medications   No medications on file  Discontinued Medications   No medications on file    Allergies: No Known Allergies  Past Medical History: Past Medical History:  Diagnosis Date   ADHD (attention deficit hyperactivity disorder), inattentive type    Allergy    Anxiety    a. Anxiety episode freshman year in high school.   Depression    Palpitations 12/15/2017   Recurrent streptococcal tonsillitis 12/11/2018   Added automatically from request for surgery [id number]     Sinus tachycardia    a. 04/2017 Echo [facility]): nl LV/RV fxn,  structurally nl heart; b. 05/2017 Event monitor (2 wks): Avg rate 82 bpm, Min HR 47, max HR 162. Predominantly sinus rhythm. Isolated SVE's/couplets (rare - <1%).    Social History: Social History   Socioeconomic History   Marital status: Single    Spouse name: Shawn   Number of children: Not on file   Years of education: 14   Highest education level: Bachelor's degree (e.g., BA, AB, BS)  Occupational History   Occupation: Parademic    Employer: [employer]  Tobacco Use   Smoking status: Never   Smokeless tobacco: Never  Vaping Use   Vaping status: Every Day   Substances: Nicotine  Substance and Sexual Activity   Alcohol use: Yes    Alcohol/week: 2.0 - 3.0 standard drinks of alcohol    Types: 2 - 3 Glasses of wine per week    Comment: light social   Drug use: No   Sexual activity: Yes  Other Topics Concern   Not on file  Social History Narrative   Lives in [place] in single dwelling home with significant other. Enjoys reading, tv shows in free time.    Social Drivers of Corporate investment banker Strain: Not on file  Food Insecurity: Not on file  Transportation Needs: Not on file  Physical Activity: Not  on file  Stress: Not on file  Social Connections: Not on file        No data to display          Labs:  SCr: Lab Results  Component Value Date   CREATININE 1.02 06/06/2023   CREATININE 1.07 03/08/2023   CREATININE 1.26 12/06/2022   CREATININE 0.99 06/20/2022   CREATININE 0.79 03/22/2022   HIV Lab Results  Component Value Date   HIV Non Reactive 06/06/2023   HIV Non Reactive 03/08/2023   HIV Non Reactive 12/06/2022   HIV Non Reactive 05/21/2020   HIV Non Reactive 05/08/2020   Hepatitis B Lab Results  Component Value Date   HEPBSAG Negative 12/06/2022   Hepatitis C No results found for: "HEPCAB", "HCVRNAPCRQN" Hepatitis A Lab Results  Component Value Date   HAV Reactive (A) 05/21/2020   RPR and STI Lab Results  Component Value Date    LABRPR Non Reactive 06/06/2023   LABRPR Non Reactive 03/08/2023   LABRPR Non Reactive 12/06/2022   LABRPR Non Reactive 05/08/2020   LABRPR Non Reactive 11/27/2019    STI Results GC GC CT CT  Latest Ref Rng & Units  NEGATIVE  NEGATIVE  06/06/2023  1:36 PM Negative   Negative    12/06/2022  3:46 PM   Negative    05/07/2020 11:29 AM Negative   Negative    11/27/2019  2:48 PM Negative   Negative    06/26/2013 12:14 PM  NEGATIVE   NEGATIVE     Assessment: Damon Avila presents to clinic today to initiate PrEP. He previously took Truvada but stopped in the past year due to scheduling conflicts for appointments and not being sexually active. He was referred here from PCP given he is interested in the injectable option Apretude. States he had trouble remembering to take Truvada daily as he takes several other daily prescription medications. Would prefer injectable therapy to increase his adherence and protection against HIV. He states he is sexually active with male partners and participates in insertive, receptive, and oral sex. He states he uses condoms ~50% of the time and has tested positive for STIs in the past. Denies any stimulant use in sex, known HIV+ partners, or history of PEP.   Counseled that Apretude is one intramuscular injection in the gluteal muscle for each visit. Explained that the second injection is 30 days after the initial injection then every 2 months thereafter. Discussed follow up appointments moving forward. Screened patient for acute HIV symptoms such as fatigue, muscle aches, rash, sore throat, lymphadenopathy, headache, night sweats, nausea/vomiting/diarrhea, and fever. Patient denies any symptoms. Explained that showing up to injection appointments is very important and warned that if appointments are missed, protection will be minimal and the risk of acquiring HIV becomes much higher. Counseled on possible side effects associated with the injections such as injection site pain,  which is usually mild to moderate in nature, injection site nodules, and injection site reactions. Asked to call the clinic or send me a mychart message if they experience any issues. Advised that they can take Motrin or Tylenol for injection site pain if needed. They may also pre-treat with Motrin or Tylenol about 30-45 minutes before scheduled appointments.  Michael's insurance has initially denied Apretude asking why he cannot take Truvada instead; will submit appeal requesting coverage today. Reviewed that they may take up to 4 weeks to respond to this request. Discussed restarting Truvada pending approval in order to increase his protection against HIV acquisition which  he agreed with as he plans to be sexually active again soon. Will resend prescription to [facility] today; will not recheck HIV testing as he recently tested negative on 3/11 and has not been sexually active since then. All baseline labs have been completed through PCP or in recent years.  Due for COVID and MPX vaccines which we did not discuss today.   Plan: - Restart Truvada until Apretude approved  - Follow-up pending Apretude approval   Margarite Gouge, PharmD, CPP, BCIDP, AAHIVP Clinical Pharmacist Practitioner Infectious Diseases Clinical Pharmacist [facility] 06/12/2023, 4:48 PM

## 2023-06-13 ENCOUNTER — Telehealth: Payer: Self-pay

## 2023-06-13 NOTE — Telephone Encounter (Signed)
 Submitted a Prior Authorization request to [employer] for Apretude via Fax. Will update once we receive a response. (Medical Benefits)  J [id number]  Fax #[phone]

## 2023-06-15 ENCOUNTER — Encounter: Admitting: Pharmacist

## 2023-06-16 ENCOUNTER — Other Ambulatory Visit: Payer: Self-pay

## 2023-06-16 ENCOUNTER — Ambulatory Visit: Admitting: Pharmacist

## 2023-06-16 ENCOUNTER — Other Ambulatory Visit (HOSPITAL_COMMUNITY): Payer: Self-pay

## 2023-06-16 ENCOUNTER — Telehealth: Payer: Self-pay | Admitting: Pharmacist

## 2023-06-16 ENCOUNTER — Other Ambulatory Visit: Payer: Self-pay | Admitting: Pharmacist

## 2023-06-16 DIAGNOSIS — Z79899 Other long term (current) drug therapy: Secondary | ICD-10-CM | POA: Diagnosis not present

## 2023-06-16 MED ORDER — EMTRICITABINE-TENOFOVIR DF 200-300 MG PO TABS
1.0000 | ORAL_TABLET | Freq: Every day | ORAL | 1 refills | Status: DC
  Filled 2023-06-16: qty 30, 30d supply, fill #0
  Filled 2023-07-05: qty 30, 30d supply, fill #1

## 2023-06-16 NOTE — Telephone Encounter (Signed)
 Received denial yesterday; will discuss plan with patient and write appeal if he wishes to pursue Apretude.

## 2023-06-16 NOTE — Progress Notes (Signed)
 Specialty Pharmacy Initial Fill Coordination Note  Damon Avila is a 23 y.o. male contacted today regarding initial fill of specialty medication(s) Emtricitabine-Tenofovir DF (TRUVADA)   Patient requested [name] at [facility] [place] date: 06/16/23   Medication will be filled on 06/16/23.   Patient is aware of 0.00 copayment.

## 2023-06-16 NOTE — Progress Notes (Signed)
 Specialty Pharmacy Initiation Note   Damon Avila is a 23 y.o. male who will be followed by the specialty pharmacy service for RxSp HIV PrEP    Review of administration, indication, effectiveness, safety, potential side effects, storage/disposable, and missed dose instructions occurred today for patient's specialty medication(s) Emtricitabine-Tenofovir DF (TRUVADA)     Patient/Caregiver did not have any additional questions or concerns.   Patient's therapy is appropriate to: Other    Goals Addressed             This Visit's Progress    Practice safe sex       Patient is on track. Patient will be evaluated at upcoming provider appointment to assess progress         Jennette Kettle Specialty Pharmacist

## 2023-06-16 NOTE — Telephone Encounter (Signed)
 Patient's insurance denied initial PA request for Apretude. Wrote appeal and faxed today. Will await response.  Margarite Gouge, PharmD, CPP, BCIDP, AAHIVP Clinical Pharmacist Practitioner Infectious Diseases Clinical Pharmacist [facility]

## 2023-06-20 ENCOUNTER — Other Ambulatory Visit (HOSPITAL_COMMUNITY): Payer: Self-pay

## 2023-07-05 ENCOUNTER — Other Ambulatory Visit: Payer: Self-pay

## 2023-07-05 ENCOUNTER — Other Ambulatory Visit (HOSPITAL_BASED_OUTPATIENT_CLINIC_OR_DEPARTMENT_OTHER): Payer: Self-pay

## 2023-07-05 NOTE — Progress Notes (Signed)
 Specialty Pharmacy Refill Coordination Note  AADAN CHENIER is a 23 y.o. male contacted today regarding refills of specialty medication(s) Emtricitabine-Tenofovir DF (TRUVADA)   Patient requested (Patient-Rptd) Delivery   Delivery date: (Patient-Rptd) 07/14/23   Verified address: (Patient-Rptd) [address], [place], [place] [address]   Medication will be filled on 04.17.25.

## 2023-07-09 ENCOUNTER — Ambulatory Visit (HOSPITAL_COMMUNITY)
Admission: EM | Admit: 2023-07-09 | Discharge: 2023-07-10 | Disposition: A | Discharge status: Home / Self Care | Attending: Nurse Practitioner | Admitting: Nurse Practitioner

## 2023-07-09 DIAGNOSIS — F411 Generalized anxiety disorder: Secondary | ICD-10-CM

## 2023-07-09 DIAGNOSIS — F331 Major depressive disorder, recurrent, moderate: Secondary | ICD-10-CM

## 2023-07-09 MED ORDER — LAMOTRIGINE 150 MG PO TABS
150.0000 mg | ORAL_TABLET | Freq: Once | ORAL | Status: AC
  Administered 2023-07-09: 150 mg via ORAL
  Filled 2023-07-09: qty 1

## 2023-07-09 MED ORDER — VILAZODONE HCL 40 MG PO TABS
40.0000 mg | ORAL_TABLET | Freq: Once | ORAL | Status: AC
  Administered 2023-07-09: 40 mg via ORAL
  Filled 2023-07-09: qty 1

## 2023-07-09 MED ORDER — BUPROPION HCL ER (XL) 150 MG PO TB24
150.0000 mg | ORAL_TABLET | Freq: Once | ORAL | Status: AC
  Administered 2023-07-09: 150 mg via ORAL
  Filled 2023-07-09: qty 1

## 2023-07-09 NOTE — ED Provider Notes (Addendum)
 Behavioral Health Urgent Care Medical Screening Exam  Patient Name: Damon Avila MRN: [id number] Date of Evaluation: 07/10/23 Chief Complaint:  I cannot afford my medication Diagnosis:  Final diagnoses:  GAD (generalized anxiety disorder)  Moderate episode of recurrent major depressive disorder (HCC)    History of Present illness: Damon Avila is a 23 y.o. male with a psychiatric history of GAD, MDD, GAD, Unspecified mood (affective disorder) presenting voluntarily and unaccompanied to [facility] requesting for medication samples due to financial burden.   Patient is alert and oriented x4 and sitting upright in the chair and answers questions appropriately during the interview. Patient reports that he has been out of his meds for 3 days. Wellbutrin  150XL-daily, Lamictal  150 mg-daily and viibryd  40 mg-daily. Patient denies any major stressors but reports having attended a friends' funeral today and on return he got emotional and had a nervous breakdown which prompted him to come to the ED. Patient thought being out of his medications for the past 3 days might be causing his emotional state. Patient had an injury during a skiing trip and had been out of work for two months which has affected him financially and also he reports that his insurance carrier that is offered at work was recently changed. 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 Patient reports that it usually with the old insurer he said all his meds cost about 40 dollars but now it is about 75 dollars. Patient  SI, HI and AVH.   Patient is being followed by Gustave Lei at [facility] and was last seen on 06/16/23. Patient has bi-weekly therapy with Sallyanne Creamer.  Patient is able to contract for safety and will be discharged to follow up with his mental health outpatient provider and therapist for ongoing medication management services.  Flowsheet Row ED from 07/09/2023 in [facility] ED from 02/06/2023 in [facility] at [facility] ED from 03/22/2022 in [facility] Emergency Department at [facility]  C-SSRS RISK CATEGORY No Risk No Risk No Risk       Psychiatric Specialty Exam  Presentation  General Appearance:Casual  Eye Contact:Good  Speech:Clear and Coherent  Speech Volume:Normal  Handedness:Right   Mood and Affect  Mood: Anxious  Affect: Appropriate   Thought Process  Thought Processes: Coherent  Descriptions of Associations:Intact  Orientation:Full (Time, Place and Person)  Thought Content:WDL    Hallucinations:None  Ideas of Reference:None  Suicidal Thoughts:No  Homicidal Thoughts:No   Sensorium  Memory: Immediate Good; Recent Good; Remote Good  Judgment: Good  Insight: Good   Executive Functions  Concentration: Good  Attention Span: Good  Recall: Good  Fund of Knowledge: Good  Language: Good   Psychomotor Activity  Psychomotor Activity: Normal   Assets  Assets: Communication Skills; Desire for Improvement; Housing; Physical Health; Vocational/Educational   Sleep  Sleep: Good  Number of hours:  8   Physical Exam: Physical Exam HENT:     Head: Normocephalic.     Nose: Nose normal.  Eyes:     Pupils: Pupils are equal, round, and reactive to light.  Cardiovascular:     Rate and Rhythm: Normal rate.  Pulmonary:     Effort: Pulmonary effort is normal.  Abdominal:     General: Abdomen is flat.  Musculoskeletal:        General: Normal range of motion.     Cervical back: Normal range of motion.  Skin:  General: Skin is warm.  Neurological:     Mental Status: He is alert and oriented to person, place, and time.  Psychiatric:        Attention and Perception: Attention normal.        Mood and Affect: Mood normal.        Speech: Speech normal.        Behavior: Behavior is cooperative.        Thought Content: Thought  content normal.        Cognition and Memory: Cognition normal.        Judgment: Judgment is impulsive.    Review of Systems  Constitutional: Negative.   HENT: Negative.    Eyes: Negative.   Respiratory: Negative.    Cardiovascular: Negative.   Gastrointestinal: Negative.   Genitourinary: Negative.   Musculoskeletal: Negative.   Skin: Negative.    Blood pressure 114/73, pulse 87, temperature 98.6 F (37 C), temperature source Oral, resp. rate 16, SpO2 99%. There is no height or weight on file to calculate BMI.  Musculoskeletal: Strength & Muscle Tone: within normal limits Gait & Station: normal Patient leans: N/A   [facility] MSE Discharge Disposition for Follow up and Recommendations: Based on my evaluation the patient does not appear to have an emergency medical condition and can be discharged with resources and follow up care in outpatient services for Medication Management and Individual Therapy   Irva Loser E Cythia Bachtel, NP 07/10/2023, 2:25 AM

## 2023-07-09 NOTE — Progress Notes (Signed)
   07/09/23 2145  [facility] Triage Screening (Walk-ins at [facility] only)  How Did You Hear About Us ? Self  What Is the Reason for Your Visit/Call Today? Pt is a 23 yo male who present to [facility] voluntarily. Pt reports that he is diagnosed with Bipolar Disorder. Pt is seeking medications samples due to him not being able to afford his medications. Pt reports that he has been out of work since Feb 2025 and he has been unable to get his medications. Pt denies SI, HI, and AVH. Pt reports that he is active with outpatient services. Pt denies etoh and drug use.  How Long Has This Been Causing You Problems? 1-6 months  Have You Recently Had Any Thoughts About Hurting Yourself? No  Are You Planning to Commit Suicide/Harm Yourself At This time? No  Have you Recently Had Thoughts About Hurting Someone Marigene Shoulder? No  Are You Planning To Harm Someone At This Time? No  Physical Abuse Denies  Verbal Abuse Denies  Sexual Abuse Denies  Exploitation of patient/patient's resources Denies  Self-Neglect Denies  Possible abuse reported to:  (n/a)  Are you currently experiencing any auditory, visual or other hallucinations? No  Have You Used Any Alcohol or Drugs in the Past 24 Hours? No  Do you have any current medical co-morbidities that require immediate attention? No  Clinician description of patient physical appearance/behavior: Pt is cooperative.  What Do You Feel Would Help You the Most Today? Stress Management;Treatment for Depression or other mood problem;Medication(s)  If access to [facility] was not available, would you have sought care in the Emergency Department? No  Determination of Need Routine (7 days)  Options For Referral Medication Management    Flowsheet Row ED from 07/09/2023 in [facility] ED from 02/06/2023 in [facility] Care at [facility] ED from 03/19/2022 in [facility] at [facility] RISK CATEGORY No Risk No Risk No Risk

## 2023-07-09 NOTE — Discharge Instructions (Signed)

## 2023-07-10 ENCOUNTER — Other Ambulatory Visit (HOSPITAL_BASED_OUTPATIENT_CLINIC_OR_DEPARTMENT_OTHER): Payer: Self-pay

## 2023-07-10 DIAGNOSIS — F331 Major depressive disorder, recurrent, moderate: Secondary | ICD-10-CM

## 2023-07-10 DIAGNOSIS — F411 Generalized anxiety disorder: Secondary | ICD-10-CM | POA: Diagnosis not present

## 2023-07-13 ENCOUNTER — Other Ambulatory Visit (HOSPITAL_COMMUNITY): Payer: Self-pay

## 2023-07-13 ENCOUNTER — Other Ambulatory Visit: Payer: Self-pay

## 2023-07-18 ENCOUNTER — Other Ambulatory Visit: Payer: Self-pay

## 2023-07-18 ENCOUNTER — Telehealth: Payer: Self-pay

## 2023-07-18 ENCOUNTER — Other Ambulatory Visit (HOSPITAL_COMMUNITY): Payer: Self-pay

## 2023-07-18 DIAGNOSIS — Z79899 Other long term (current) drug therapy: Secondary | ICD-10-CM

## 2023-07-18 MED ORDER — APRETUDE 600 MG/3ML IM SUER
600.0000 mg | INTRAMUSCULAR | 1 refills | Status: DC
  Filled 2023-07-18: qty 3, 28d supply, fill #0
  Filled 2023-07-31: qty 3, 28d supply, fill #1

## 2023-07-18 NOTE — Addendum Note (Signed)
 Addended by: Sonya Duster on: 07/18/2023 03:39 PM   Modules accepted: Orders

## 2023-07-18 NOTE — Progress Notes (Signed)
 Specialty Pharmacy Initial Fill Coordination Note  Damon Avila is a 23 y.o. male contacted today regarding initial fill of specialty medication(s) Cabotegravir  (Apretude )   Patient requested Courier to Provider Office   Delivery date: 07/21/23   Verified address: [address] [place] [address]   Medication will be filled on 07/20/23.   Patient is aware of 0.00 copayment.

## 2023-07-18 NOTE — Telephone Encounter (Signed)
 Pharmacy Patient Advocate Encounter- Apretude  BIV-Pharmacy Benefit:  PA was submitted to [employer] and has been approved through: 07/18/23-03/27/2098 (3 mls for 28 days) Authorization# [id number]  Please send prescription to Specialty Pharmacy: [facility]: [phone]  Estimated Copay is: 0.00

## 2023-07-18 NOTE — Telephone Encounter (Signed)
 Script sent - seeing Damon Avila on 4/28 for 1st injection.

## 2023-07-19 ENCOUNTER — Other Ambulatory Visit: Payer: Self-pay

## 2023-07-20 ENCOUNTER — Other Ambulatory Visit: Payer: Self-pay

## 2023-07-21 ENCOUNTER — Telehealth: Payer: Self-pay

## 2023-07-21 NOTE — Telephone Encounter (Signed)
 [facility] Patient Advocate Encounter  Patient's medications APRETUDE  have been couriered to [facility] from [facility] and will be administered at the patients appointment on 07/24/23.  Verline Glow, CPhT Specialty Pharmacy Patient [facility] Phone: [phone] Fax:  [phone]

## 2023-07-23 NOTE — Progress Notes (Unsigned)
 HPI: Damon Avila is a 23 y.o. male who presents to the [facility] pharmacy clinic for Apretude  administration and HIV PrEP follow up.  Insured   [x]    Uninsured  []    Patient Active Problem List   Diagnosis Date Noted   Closed fracture of shaft of left tibia 06/06/2023   High risk homosexual behavior 12/06/2022   Mood disorder (HCC) 09/23/2022   Seasonal allergies 05/18/2021   History of COVID-19 05/07/2020   Attention deficit hyperactivity disorder (ADHD), combined type 06/26/2013   GAD (generalized anxiety disorder) 06/26/2013   MDD (major depressive disorder), single episode, severe (HCC) 06/25/2013    Patient's Medications  New Prescriptions   No medications on file  Previous Medications   BUPROPION  (WELLBUTRIN  XL) 150 MG 24 HR TABLET    Take 1 tablet (150 mg total) by mouth daily.   CABOTEGRAVIR  ER (APRETUDE ) 600 MG/3ML INJECTION    Inject 3 mLs (600 mg total) into the muscle every 30 (thirty) days.   CETIRIZINE (ZYRTEC) 10 MG TABLET    Take 10 mg by mouth 2 (two) times daily.   EMTRICITABINE -TENOFOVIR  (TRUVADA ) 200-300 MG TABLET    Take 1 tablet by mouth daily.   LAMOTRIGINE  (LAMICTAL ) 150 MG TABLET    Take 1 tablet (150 mg total) by mouth daily.   LISDEXAMFETAMINE (VYVANSE ) 20 MG CAPSULE    Take 1 capsule (20 mg total) by mouth daily.   LORAZEPAM  (ATIVAN ) 0.5 MG TABLET    Take 1 tablet (0.5 mg total) by mouth every 8 (eight) hours.   VILAZODONE  HCL (VIIBRYD ) 40 MG TABS    Take 1 tablet (40 mg total) by mouth daily.  Modified Medications   No medications on file  Discontinued Medications   No medications on file    Allergies: No Known Allergies  Labs: No results found for: "HIV1RNAQUANT", "HIV1RNAVL", "CD4TABS"  RPR and STI Lab Results  Component Value Date   LABRPR Non Reactive 06/06/2023   LABRPR Non Reactive 03/08/2023   LABRPR Non Reactive 12/06/2022   LABRPR Non Reactive 05/08/2020   LABRPR Non Reactive 11/27/2019    STI Results GC GC CT CT  Latest Ref  Rng & Units  NEGATIVE  NEGATIVE  06/06/2023  1:36 PM Negative   Negative    12/06/2022  3:46 PM   Negative    05/07/2020 11:29 AM Negative   Negative    11/27/2019  2:48 PM Negative   Negative    06/26/2013 12:14 PM  NEGATIVE   NEGATIVE     Hepatitis B Lab Results  Component Value Date   HEPBSAG Negative 12/06/2022   Hepatitis C No results found for: "HEPCAB", "HCVRNAPCRQN" Hepatitis A Lab Results  Component Value Date   HAV Reactive (A) 05/21/2020   Lipids: Lab Results  Component Value Date   CHOL 181 06/06/2023   TRIG 110 06/06/2023   HDL 36 (L) 06/06/2023   CHOLHDL 5.0 06/06/2023   LDLCALC 125 (H) 06/06/2023    Current PrEP Regimen: Truvada   TARGET DATE: 28  Assessment: Damon Avila presents today for their first initiation injection of Apretude  and to follow up for HIV PrEP.  Counseled that Apretude  is one intramuscular injection in the gluteal muscle for each visit. Explained that the second injection is 30 days after the initial injection then every 2 months thereafter. Discussed follow up appointments moving forward. Screened for acute HIV symptoms such as fatigue, muscle aches, rash, sore throat, lymphadenopathy, headache, night sweats, nausea/vomiting/diarrhea, and fever. Denies any symptoms.  No known exposures to any STIs since last visit. Agrees to urine, rectal STI testing and RPR today.  Explained that showing up to injection appointments is very important and warned that if appointments are missed, protection will be minimal and the risk of acquiring HIV becomes much higher. Counseled on possible side effects associated with the injections such as injection site pain, which is usually mild to moderate in nature, injection site nodules, and injection site reactions. Asked to call the clinic or send me a mychart message if they experience any issues. Advised that he can take Motrin or Tylenol  for injection site pain if needed. He may also pre-treat with Motrin or  Tylenol  30-45 minutes before scheduled appointments.   Per [employer] guidelines, a rapid HIV test should be drawn prior to Apretude  administration. Due to state shortage of rapid HIV tests, this is temporarily unable to be done. Per decision from [facility] physicians, we will proceed with Apretude  administration at this time without a negative rapid HIV test beforehand. HIV RNA was collected today and is in process.  Administered cabotegravir  600mg /54mL in left upper outer quadrant of the gluteal muscle. Monitored patient for 10 minutes after injection. Injection was tolerated well without issue. Counseled to stop taking Truvada  after today's dose and to call with any issues that may arise. Will make follow up appointments for second initiation injection in 30 days and then maintenance injections every 2 months thereafter.   Plan: - Stop Truvada  after dose today - First Apretude  injection administered - Urine, rectal cytologies, RPR today - Second initiation injection scheduled for Aug 23 2023 with Mylinda Asa @ 3pm - Maintenance injections scheduled for October 19 2023 with Mylinda Asa @ 3 pm - Call with any issues or questions

## 2023-07-24 ENCOUNTER — Other Ambulatory Visit: Payer: Self-pay

## 2023-07-24 ENCOUNTER — Ambulatory Visit (INDEPENDENT_AMBULATORY_CARE_PROVIDER_SITE_OTHER): Admitting: Pharmacist

## 2023-07-24 DIAGNOSIS — Z2981 Encounter for HIV pre-exposure prophylaxis: Secondary | ICD-10-CM

## 2023-07-24 DIAGNOSIS — Z113 Encounter for screening for infections with a predominantly sexual mode of transmission: Secondary | ICD-10-CM

## 2023-07-24 DIAGNOSIS — Z79899 Other long term (current) drug therapy: Secondary | ICD-10-CM

## 2023-07-24 MED ORDER — CABOTEGRAVIR ER 600 MG/3ML IM SUER
600.0000 mg | Freq: Once | INTRAMUSCULAR | Status: AC
  Administered 2023-07-24: 600 mg via INTRAMUSCULAR

## 2023-07-25 LAB — CT/NG RNA, TMA RECTAL
Chlamydia Trachomatis RNA: NOT DETECTED
Neisseria Gonorrhoeae RNA: NOT DETECTED

## 2023-07-25 LAB — C. TRACHOMATIS/N. GONORRHOEAE RNA
C. trachomatis RNA, TMA: NOT DETECTED
N. gonorrhoeae RNA, TMA: NOT DETECTED

## 2023-07-26 ENCOUNTER — Encounter (HOSPITAL_BASED_OUTPATIENT_CLINIC_OR_DEPARTMENT_OTHER): Payer: Self-pay | Admitting: Family Medicine

## 2023-07-26 ENCOUNTER — Other Ambulatory Visit (HOSPITAL_BASED_OUTPATIENT_CLINIC_OR_DEPARTMENT_OTHER): Payer: Self-pay | Admitting: Family Medicine

## 2023-07-26 LAB — HIV-1 RNA QUANT-NO REFLEX-BLD
HIV 1 RNA Quant: NOT DETECTED {copies}/mL
HIV-1 RNA Quant, Log: NOT DETECTED {Log_copies}/mL

## 2023-07-26 LAB — RPR: RPR Ser Ql: NONREACTIVE

## 2023-07-26 NOTE — Telephone Encounter (Signed)
 Jon Gills, Please see mychart message sent by pt and advise.

## 2023-07-31 ENCOUNTER — Encounter (HOSPITAL_BASED_OUTPATIENT_CLINIC_OR_DEPARTMENT_OTHER): Payer: Self-pay | Admitting: Family Medicine

## 2023-07-31 ENCOUNTER — Other Ambulatory Visit (HOSPITAL_COMMUNITY): Payer: Self-pay

## 2023-07-31 ENCOUNTER — Other Ambulatory Visit: Payer: Self-pay

## 2023-07-31 NOTE — Progress Notes (Signed)
 Specialty Pharmacy Refill Coordination Note  Damon Avila is a 23 y.o. male assessed today regarding refills of clinic administered specialty medication(s) Cabotegravir  (Apretude )   Clinic requested Courier to Provider Office   Delivery date: 08/18/23   Verified address: [address] [place] [address]   Medication will be filled on 08/17/23.

## 2023-08-18 ENCOUNTER — Telehealth: Payer: Self-pay

## 2023-08-18 NOTE — Telephone Encounter (Signed)
 [facility] Patient Advocate Encounter  Patient's medications CABENUVA have been couriered to [facility] from [facility] and will be administered at the patients appointment on 08/23/23.  Verline Glow, CPhT Specialty Pharmacy Patient [facility] for Infectious Disease Phone: [phone] Fax:  [phone]

## 2023-08-22 NOTE — Progress Notes (Unsigned)
 HPI: Damon Avila is a 23 y.o. male who presents to the [facility] pharmacy clinic for Apretude  administration and HIV PrEP follow up.  Insured   [x]    Uninsured  []    Patient Active Problem List   Diagnosis Date Noted   Closed fracture of shaft of left tibia 06/06/2023   High risk homosexual behavior 12/06/2022   Mood disorder (HCC) 09/23/2022   Seasonal allergies 05/18/2021   History of COVID-19 05/07/2020   Attention deficit hyperactivity disorder (ADHD), combined type 06/26/2013   GAD (generalized anxiety disorder) 06/26/2013   MDD (major depressive disorder), single episode, severe (HCC) 06/25/2013    Patient's Medications  New Prescriptions   No medications on file  Previous Medications   BUPROPION  (WELLBUTRIN  XL) 150 MG 24 HR TABLET    Take 1 tablet (150 mg total) by mouth daily.   CABOTEGRAVIR  ER (APRETUDE ) 600 MG/3ML INJECTION    Inject 3 mLs (600 mg total) into the muscle every 30 (thirty) days.   CETIRIZINE (ZYRTEC) 10 MG TABLET    Take 10 mg by mouth 2 (two) times daily.   EMTRICITABINE -TENOFOVIR  (TRUVADA ) 200-300 MG TABLET    Take 1 tablet by mouth daily.   LAMOTRIGINE  (LAMICTAL ) 150 MG TABLET    Take 1 tablet (150 mg total) by mouth daily.   LISDEXAMFETAMINE (VYVANSE ) 20 MG CAPSULE    Take 1 capsule (20 mg total) by mouth daily.   LORAZEPAM  (ATIVAN ) 0.5 MG TABLET    Take 1 tablet (0.5 mg total) by mouth every 8 (eight) hours.   VILAZODONE  HCL (VIIBRYD ) 40 MG TABS    Take 1 tablet (40 mg total) by mouth daily.  Modified Medications   No medications on file  Discontinued Medications   No medications on file    Allergies: No Known Allergies  Labs: Lab Results  Component Value Date   HIV1RNAQUANT NOT DETECTED 07/24/2023    RPR and STI Lab Results  Component Value Date   LABRPR NON-REACTIVE 07/24/2023   LABRPR Non Reactive 06/06/2023   LABRPR Non Reactive 03/08/2023   LABRPR Non Reactive 12/06/2022   LABRPR Non Reactive 05/08/2020    STI Results GC GC CT  CT  Latest Ref Rng & Units  NEGATIVE  NEGATIVE  06/06/2023  1:36 PM Negative   Negative    12/06/2022  3:46 PM   Negative    05/07/2020 11:29 AM Negative   Negative    11/27/2019  2:48 PM Negative   Negative    06/26/2013 12:14 PM  NEGATIVE   NEGATIVE     Hepatitis B Lab Results  Component Value Date   HEPBSAG Negative 12/06/2022   Hepatitis C No results found for: "HEPCAB", "HCVRNAPCRQN" Hepatitis A Lab Results  Component Value Date   HAV Reactive (A) 05/21/2020   Lipids: Lab Results  Component Value Date   CHOL 181 06/06/2023   TRIG 110 06/06/2023   HDL 36 (L) 06/06/2023   CHOLHDL 5.0 06/06/2023   LDLCALC 125 (H) 06/06/2023    TARGET DATE: The 28th  Assessment: Damon Avila presents today for his Apretude  injection and to follow up for HIV PrEP. No issues with past injections. Denies any symptoms of acute HIV. Last STI screening was on 07/24/23 and was negative. No known exposures to any STIs since last visit. ***Agrees to STI testing today.    Per [employer] guidelines, a rapid HIV test should be drawn prior to Apretude  administration. Due to state shortage of rapid HIV tests, this is temporarily  unable to be done. Per decision from [facility] physicians, we will proceed with Apretude  administration at this time without a negative rapid HIV test beforehand. HIV RNA was collected today and is in process.  Administered cabotegravir  600mg /41mL in *** upper outer quadrant of the gluteal muscle. Will see him back in 2 months for injection, labs, and HIV PrEP follow up.  STI screening ? Urine/rectal?   Plan: - Apretude  injection administered - HIV RNA, RPR, urine/rectal cytologies today - Next injection, labs, and PrEP follow up appointment scheduled for 10/19/23 with Mylinda Asa - Call with any issues or questions  Georga Killings, PharmD PGY-1 Pharmacy Resident

## 2023-08-23 ENCOUNTER — Ambulatory Visit (INDEPENDENT_AMBULATORY_CARE_PROVIDER_SITE_OTHER): Admitting: Pharmacist

## 2023-08-23 ENCOUNTER — Other Ambulatory Visit: Payer: Self-pay

## 2023-08-23 ENCOUNTER — Other Ambulatory Visit: Payer: Self-pay | Admitting: Behavioral Health

## 2023-08-23 DIAGNOSIS — Z113 Encounter for screening for infections with a predominantly sexual mode of transmission: Secondary | ICD-10-CM

## 2023-08-23 DIAGNOSIS — F902 Attention-deficit hyperactivity disorder, combined type: Secondary | ICD-10-CM

## 2023-08-23 DIAGNOSIS — Z79899 Other long term (current) drug therapy: Secondary | ICD-10-CM

## 2023-08-23 DIAGNOSIS — Z2981 Encounter for HIV pre-exposure prophylaxis: Secondary | ICD-10-CM | POA: Diagnosis not present

## 2023-08-23 MED ORDER — CABOTEGRAVIR ER 600 MG/3ML IM SUER
600.0000 mg | Freq: Once | INTRAMUSCULAR | Status: AC
  Administered 2023-08-23: 600 mg via INTRAMUSCULAR

## 2023-08-24 LAB — CT/NG RNA, TMA RECTAL
Chlamydia Trachomatis RNA: NOT DETECTED
Neisseria Gonorrhoeae RNA: NOT DETECTED

## 2023-08-25 ENCOUNTER — Other Ambulatory Visit (HOSPITAL_BASED_OUTPATIENT_CLINIC_OR_DEPARTMENT_OTHER): Payer: Self-pay

## 2023-08-25 LAB — HIV-1 RNA QUANT-NO REFLEX-BLD
HIV 1 RNA Quant: NOT DETECTED {copies}/mL
HIV-1 RNA Quant, Log: NOT DETECTED {Log_copies}/mL

## 2023-08-25 MED ORDER — LISDEXAMFETAMINE DIMESYLATE 20 MG PO CAPS
20.0000 mg | ORAL_CAPSULE | Freq: Every day | ORAL | 0 refills | Status: AC
  Filled 2023-08-25 – 2023-10-19 (×2): qty 30, 30d supply, fill #0

## 2023-09-04 ENCOUNTER — Other Ambulatory Visit (HOSPITAL_BASED_OUTPATIENT_CLINIC_OR_DEPARTMENT_OTHER): Payer: Self-pay

## 2023-09-13 ENCOUNTER — Ambulatory Visit: Admitting: Behavioral Health

## 2023-09-13 ENCOUNTER — Other Ambulatory Visit (HOSPITAL_BASED_OUTPATIENT_CLINIC_OR_DEPARTMENT_OTHER): Payer: Self-pay

## 2023-09-13 ENCOUNTER — Encounter: Payer: Self-pay | Admitting: Behavioral Health

## 2023-09-13 DIAGNOSIS — F39 Unspecified mood [affective] disorder: Secondary | ICD-10-CM

## 2023-09-13 DIAGNOSIS — F331 Major depressive disorder, recurrent, moderate: Secondary | ICD-10-CM | POA: Diagnosis not present

## 2023-09-13 DIAGNOSIS — F411 Generalized anxiety disorder: Secondary | ICD-10-CM | POA: Diagnosis not present

## 2023-09-13 MED ORDER — LAMOTRIGINE 150 MG PO TABS
150.0000 mg | ORAL_TABLET | Freq: Every day | ORAL | 1 refills | Status: DC
  Filled 2023-09-13 – 2023-10-19 (×2): qty 90, 90d supply, fill #0
  Filled 2024-01-26: qty 90, 90d supply, fill #1

## 2023-09-13 MED ORDER — BUPROPION HCL ER (XL) 150 MG PO TB24
150.0000 mg | ORAL_TABLET | Freq: Every day | ORAL | 1 refills | Status: DC
  Filled 2023-09-13 – 2023-10-19 (×2): qty 90, 90d supply, fill #0
  Filled 2024-01-26: qty 90, 90d supply, fill #1

## 2023-09-13 MED ORDER — VILAZODONE HCL 40 MG PO TABS
40.0000 mg | ORAL_TABLET | Freq: Every day | ORAL | 1 refills | Status: DC
  Filled 2023-09-13 – 2023-10-19 (×2): qty 90, 90d supply, fill #0
  Filled 2024-01-26: qty 90, 90d supply, fill #1

## 2023-09-13 NOTE — Progress Notes (Signed)
 [facility]  Patient ID: Damon Avila,  MRN: [id number]  PCP: Nonda Bays, FNP  Date of Evaluation: 09/13/2023 Time spent:20 minutes  Chief Complaint:   HISTORY/CURRENT STATUS: HPI  Damon Avila, 23 year old male presents to this office for follow up and medication management. Collateral information should be considered reliable.  Patient is very pleasant, cooperative, and articulate. Feels like his medication was working well overall.  He says he ran out of medication for about 3 days due to finances but is now currently taking as prescribed. Says he presented to the [facility] to see if they could help him get medications. Starting new job in two weeks. Requesting no medication change. No medication changes this visit.  Reports anxiety at 3/10 and depression at 3/10.  Sleeping 7-8 hours per night. Reports eating well.   He denies any current mania today, no psychosis, no history of auditory or visual hallucinations.  No SI or HI.  He does have 1 prior hospitalization in 2015 for major depressive disorder.  Past psychiatric medication trials: Zoloft  Adderall Ritalin Vyvanse      Individual Medical History/ Review of Systems: Changes? :No   Allergies: Patient has no known allergies.  Current Medications:  Current Outpatient Medications:    buPROPion  (WELLBUTRIN  XL) 150 MG 24 hr tablet, Take 1 tablet (150 mg total) by mouth daily., Disp: 90 tablet, Rfl: 1   cabotegravir  ER (APRETUDE ) 600 MG/3ML injection, Inject 3 mLs (600 mg total) into the muscle every 30 (thirty) days., Disp: 3 mL, Rfl: 1   cetirizine (ZYRTEC) 10 MG tablet, Take 10 mg by mouth 2 (two) times daily., Disp: , Rfl:    lamoTRIgine  (LAMICTAL ) 150 MG tablet, Take 1 tablet (150 mg total) by mouth daily., Disp: 90 tablet, Rfl: 1   lisdexamfetamine (VYVANSE ) 20 MG capsule, Take 1 capsule (20 mg total) by mouth daily., Disp: 30 capsule, Rfl: 0   LORazepam  (ATIVAN ) 0.5 MG tablet, Take 1 tablet (0.5 mg total) by  mouth every 8 (eight) hours., Disp: 20 tablet, Rfl: 1   Vilazodone  HCl (VIIBRYD ) 40 MG TABS, Take 1 tablet (40 mg total) by mouth daily., Disp: 90 tablet, Rfl: 1 Medication Side Effects: none  Family Medical/ Social History: Changes? No  MENTAL HEALTH EXAM:  There were no vitals taken for this visit.There is no height or weight on file to calculate BMI.  General Appearance: Casual, Neat, and Well Groomed  Eye Contact:  Good  Speech:  Clear and Coherent  Volume:  Normal  Mood:  NA  Affect:  Appropriate  Thought Process:  Coherent  Orientation:  Full (Time, Place, and Person)  Thought Content: Logical   Suicidal Thoughts:  No  Homicidal Thoughts:  No  Memory:  WNL  Judgement:  Good  Insight:  Good  Psychomotor Activity:  Normal  Concentration:  Concentration: Good  Recall:  Good  Fund of Knowledge: Good  Language: Good  Assets:  Desire for Improvement  ADL's:  Intact  Cognition: WNL  Prognosis:  Good    DIAGNOSES:    ICD-10-CM   1. Major depressive disorder, recurrent episode, moderate (HCC)  F33.1 Vilazodone  HCl (VIIBRYD ) 40 MG TABS    buPROPion  (WELLBUTRIN  XL) 150 MG 24 hr tablet    lamoTRIgine  (LAMICTAL ) 150 MG tablet    2. Generalized anxiety disorder  F41.1 Vilazodone  HCl (VIIBRYD ) 40 MG TABS    buPROPion  (WELLBUTRIN  XL) 150 MG 24 hr tablet    3. Unspecified mood (affective) disorder (HCC)  F39 Vilazodone  HCl (VIIBRYD )  40 MG TABS    buPROPion  (WELLBUTRIN  XL) 150 MG 24 hr tablet    lamoTRIgine  (LAMICTAL ) 150 MG tablet      Receiving Psychotherapy: No    RECOMMENDATIONS:   Greater than 50% of 30 min face to face time with patient was spent on counseling and coordination of care. No new social changes. Discussed his continued improvement with anxiety and depression. Reinforced  pt to  exercise more, go for walks outside, increase uv exposure on nice days.  Requesting no med changes.       We agreed today to:   Continue Viibryd  to 40 mg daily To start  Wellbutrin  150 mg XL daily Continue Lamictal  to 150 mg daily Continue Ativan ,  0.5 mg daily as needed only for severe anixety Continue Vyvanse  20 mg daily in the am after breakfast. Will follow-up in 12 weeks to reassess Provided emergency contact information To report side effects or worsening symptoms promptly Monitor for any sign of rash. Please taking Lamictal  and contact office immediately rash develops. Recommend seeking urgent medical attention if rash is severe and/or spreading quickly.  Reviewed PDMP     Lincoln Renshaw, NP

## 2023-09-21 ENCOUNTER — Telehealth (HOSPITAL_BASED_OUTPATIENT_CLINIC_OR_DEPARTMENT_OTHER): Payer: Self-pay | Admitting: *Deleted

## 2023-09-21 NOTE — Telephone Encounter (Signed)
 Attempted to call patient and let him know this is ready for pick up NA NVM

## 2023-09-21 NOTE — Telephone Encounter (Signed)
 Copied from CRM [id number]. Topic: Medical Record Request - Records Request >> Sep 21, 2023 12:06 PM Elle L wrote: Reason for CRM: The patient is requesting his immunization records be printed off for him to pick up. The patient's call back number is [phone].

## 2023-09-26 ENCOUNTER — Other Ambulatory Visit (HOSPITAL_BASED_OUTPATIENT_CLINIC_OR_DEPARTMENT_OTHER): Payer: Self-pay

## 2023-10-09 ENCOUNTER — Other Ambulatory Visit: Payer: Self-pay

## 2023-10-09 ENCOUNTER — Other Ambulatory Visit (HOSPITAL_COMMUNITY): Payer: Self-pay

## 2023-10-09 ENCOUNTER — Other Ambulatory Visit: Payer: Self-pay | Admitting: Pharmacist

## 2023-10-09 DIAGNOSIS — Z79899 Other long term (current) drug therapy: Secondary | ICD-10-CM

## 2023-10-09 MED ORDER — APRETUDE 600 MG/3ML IM SUER
600.0000 mg | INTRAMUSCULAR | 5 refills | Status: AC
  Filled 2023-10-09: qty 3, 60d supply, fill #0

## 2023-10-09 NOTE — Progress Notes (Signed)
 Specialty Pharmacy Refill Coordination Note  Damon Avila is a 23 y.o. male assessed today regarding refills of clinic administered specialty medication(s) Cabotegravir  (Apretude )   Clinic requested Courier to Provider Office   Delivery date: 10/16/23   Verified address: [address] [address]   Medication will be filled on 10/13/23.

## 2023-10-13 ENCOUNTER — Other Ambulatory Visit: Payer: Self-pay

## 2023-10-16 ENCOUNTER — Telehealth: Payer: Self-pay

## 2023-10-16 NOTE — Telephone Encounter (Signed)
 [facility] Patient Advocate Encounter  Patient's medications Apretude  have been couriered to [facility] from [facility] and will be administered at the patients appointment on 10/19/23.  Damon Avila, CPhT Specialty Pharmacy Patient [facility] for Infectious Disease Phone: [phone] Fax:  [phone]

## 2023-10-17 NOTE — Progress Notes (Deleted)
 HPI: Damon Avila is a 23 y.o. male who presents to the [facility] pharmacy clinic for Apretude  administration and HIV PrEP follow up.  Patient Active Problem List   Diagnosis Date Noted   Closed fracture of shaft of left tibia 06/06/2023   High risk homosexual behavior 12/06/2022   Mood disorder (HCC) 09/23/2022   Seasonal allergies 05/18/2021   History of COVID-19 05/07/2020   Attention deficit hyperactivity disorder (ADHD), combined type 06/26/2013   GAD (generalized anxiety disorder) 06/26/2013   MDD (major depressive disorder), single episode, severe (HCC) 06/25/2013    Patient's Medications  New Prescriptions   No medications on file  Previous Medications   BUPROPION  (WELLBUTRIN  XL) 150 MG 24 HR TABLET    Take 1 tablet (150 mg total) by mouth daily.   CABOTEGRAVIR  ER (APRETUDE ) 600 MG/3ML INJECTION    Inject 3 mLs (600 mg total) into the muscle every 2 (two) months.   CETIRIZINE (ZYRTEC) 10 MG TABLET    Take 10 mg by mouth 2 (two) times daily.   LAMOTRIGINE  (LAMICTAL ) 150 MG TABLET    Take 1 tablet (150 mg total) by mouth daily.   LISDEXAMFETAMINE (VYVANSE ) 20 MG CAPSULE    Take 1 capsule (20 mg total) by mouth daily.   LORAZEPAM  (ATIVAN ) 0.5 MG TABLET    Take 1 tablet (0.5 mg total) by mouth every 8 (eight) hours.   VILAZODONE  HCL (VIIBRYD ) 40 MG TABS    Take 1 tablet (40 mg total) by mouth daily.  Modified Medications   No medications on file  Discontinued Medications   No medications on file    Allergies: No Known Allergies  Past Medical History: Past Medical History:  Diagnosis Date   ADHD (attention deficit hyperactivity disorder), inattentive type    Allergy    Anxiety    a. Anxiety episode freshman year in high school.   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 04/2017 Echo [facility]): nl LV/RV fxn, structurally nl heart; b. 05/2017 Event  monitor (2 wks): Avg rate 82 bpm, Min HR 47, max HR 162. Predominantly sinus rhythm. Isolated SVE's/couplets (rare - <1%).    Social History: Social History   Socioeconomic History   Marital status: Single    Spouse name: Shawn   Number of children: Not on file   Years of education: 14   Highest education level: Bachelor's degree (e.g., BA, AB, BS)  Occupational History   Occupation: Parademic    Employer: [employer]  Tobacco Use   Smoking status: Never   Smokeless tobacco: Never  Vaping Use   Vaping status: Every Day   Substances: Nicotine  Substance and Sexual Activity   Alcohol use: Yes    Alcohol/week: 2.0 - 3.0 standard drinks of alcohol    Types: 2 - 3 Glasses of wine per week    Comment: light social   Drug use: No   Sexual activity: Yes  Other Topics Concern   Not on file  Social History Narrative   Lives in [place] in single dwelling home with significant other. Enjoys reading, tv shows in free time.    Social Drivers of Corporate investment banker Strain: Not on file  Food Insecurity: Not on file  Transportation Needs: Not on file  Physical Activity: Not on file  Stress: Not on file  Social Connections: Not on  file    Labs: Lab Results  Component Value Date   HIV1RNAQUANT NOT DETECTED 08/23/2023   HIV1RNAQUANT NOT DETECTED 07/24/2023    RPR and STI Lab Results  Component Value Date   LABRPR NON-REACTIVE 07/24/2023   LABRPR Non Reactive 06/06/2023   LABRPR Non Reactive 03/08/2023   LABRPR Non Reactive 12/06/2022   LABRPR Non Reactive 05/08/2020    STI Results GC GC CT CT  Latest Ref Rng & Units  NEGATIVE  NEGATIVE  06/06/2023  1:36 PM Negative   Negative    12/06/2022  3:46 PM   Negative    05/07/2020 11:29 AM Negative   Negative    11/27/2019  2:48 PM Negative   Negative    06/26/2013 12:14 PM  NEGATIVE   NEGATIVE     Hepatitis B Lab Results  Component Value Date   HEPBSAG Negative 12/06/2022   Hepatitis C No results found for:  HEPCAB, HCVRNAPCRQN Hepatitis A Lab Results  Component Value Date   HAV Reactive (A) 05/21/2020   Lipids: Lab Results  Component Value Date   CHOL 181 06/06/2023   TRIG 110 06/06/2023   HDL 36 (L) 06/06/2023   CHOLHDL 5.0 06/06/2023   LDLCALC 125 (H) 06/06/2023    TARGET DATE: The 28th of the month  Assessment: Ozell presents today for their Apretude  injection and to follow up for HIV PrEP. No issues with past injections.  Screened patient for acute HIV symptoms such as fatigue, muscle aches, rash, sore throat, lymphadenopathy, headache, night sweats, nausea/vomiting/diarrhea, and fever. Patient denies any symptoms.   Per [employer] guidelines, a rapid HIV test should be drawn prior to Apretude  administration. Due to state shortage of rapid HIV tests, this is temporarily unable to be done. Per decision from [facility] physicians, we will proceed with Apretude  administration at this time without a negative rapid HIV test beforehand. HIV RNA was collected today and is in process.  Administered cabotegravir  600mg /70mL in *** upper outer quadrant of the gluteal muscle. Will make follow up appointments for maintenance injections every 2 months.   No known exposures to any STIs and no signs or symptoms of any STIs today. Last STI screening was *** and was negative. *** new partners since last injection; will check *** today.   Plan: - Administer Apretude  600 mg x 1  - Maintenance injections scheduled for *** - Check HIV RNA and *** - Call with any issues or questions  Alan Geralds, PharmD, CPP, BCIDP, AAHIVP Clinical Pharmacist Practitioner Infectious Diseases Clinical Pharmacist [facility]

## 2023-10-19 ENCOUNTER — Ambulatory Visit: Payer: Self-pay | Admitting: Pharmacist

## 2023-10-19 ENCOUNTER — Encounter: Payer: Self-pay | Admitting: Pharmacist

## 2023-10-19 ENCOUNTER — Other Ambulatory Visit (HOSPITAL_BASED_OUTPATIENT_CLINIC_OR_DEPARTMENT_OTHER): Payer: Self-pay

## 2023-10-19 DIAGNOSIS — Z113 Encounter for screening for infections with a predominantly sexual mode of transmission: Secondary | ICD-10-CM

## 2023-10-19 DIAGNOSIS — Z79899 Other long term (current) drug therapy: Secondary | ICD-10-CM

## 2023-12-05 ENCOUNTER — Other Ambulatory Visit: Payer: Self-pay

## 2023-12-14 ENCOUNTER — Ambulatory Visit (INDEPENDENT_AMBULATORY_CARE_PROVIDER_SITE_OTHER): Payer: Self-pay | Admitting: Behavioral Health

## 2023-12-14 DIAGNOSIS — Z0389 Encounter for observation for other suspected diseases and conditions ruled out: Secondary | ICD-10-CM

## 2023-12-14 NOTE — Progress Notes (Signed)
Pt did not show for scheduled appt and did not provide 24 hour notice. Additional fees to be assessed.

## 2023-12-25 ENCOUNTER — Other Ambulatory Visit: Payer: Self-pay

## 2023-12-25 NOTE — Progress Notes (Signed)
 Patient will not be apretude  any longer

## 2024-01-26 ENCOUNTER — Other Ambulatory Visit: Payer: Self-pay | Admitting: Behavioral Health

## 2024-01-26 ENCOUNTER — Other Ambulatory Visit (HOSPITAL_BASED_OUTPATIENT_CLINIC_OR_DEPARTMENT_OTHER): Payer: Self-pay

## 2024-01-26 DIAGNOSIS — F902 Attention-deficit hyperactivity disorder, combined type: Secondary | ICD-10-CM

## 2024-01-29 ENCOUNTER — Other Ambulatory Visit (HOSPITAL_BASED_OUTPATIENT_CLINIC_OR_DEPARTMENT_OTHER): Payer: Self-pay

## 2024-02-06 ENCOUNTER — Other Ambulatory Visit (HOSPITAL_BASED_OUTPATIENT_CLINIC_OR_DEPARTMENT_OTHER): Payer: Self-pay

## 2024-02-07 ENCOUNTER — Other Ambulatory Visit (HOSPITAL_BASED_OUTPATIENT_CLINIC_OR_DEPARTMENT_OTHER): Payer: Self-pay

## 2024-02-09 ENCOUNTER — Other Ambulatory Visit (HOSPITAL_BASED_OUTPATIENT_CLINIC_OR_DEPARTMENT_OTHER): Payer: Self-pay

## 2024-02-12 ENCOUNTER — Other Ambulatory Visit (HOSPITAL_BASED_OUTPATIENT_CLINIC_OR_DEPARTMENT_OTHER): Payer: Self-pay

## 2024-05-01 ENCOUNTER — Ambulatory Visit: Admitting: Behavioral Health

## 2024-05-01 ENCOUNTER — Encounter: Payer: Self-pay | Admitting: Behavioral Health

## 2024-05-01 ENCOUNTER — Other Ambulatory Visit (HOSPITAL_BASED_OUTPATIENT_CLINIC_OR_DEPARTMENT_OTHER): Payer: Self-pay

## 2024-05-01 DIAGNOSIS — F411 Generalized anxiety disorder: Secondary | ICD-10-CM

## 2024-05-01 DIAGNOSIS — F331 Major depressive disorder, recurrent, moderate: Secondary | ICD-10-CM

## 2024-05-01 DIAGNOSIS — F39 Unspecified mood [affective] disorder: Secondary | ICD-10-CM

## 2024-05-01 MED ORDER — VILAZODONE HCL 40 MG PO TABS
40.0000 mg | ORAL_TABLET | Freq: Every day | ORAL | 1 refills | Status: AC
  Filled 2024-05-01: qty 90, 90d supply, fill #0

## 2024-05-01 MED ORDER — LAMOTRIGINE 150 MG PO TABS
150.0000 mg | ORAL_TABLET | Freq: Every day | ORAL | 1 refills | Status: AC
  Filled 2024-05-01: qty 90, 90d supply, fill #0

## 2024-05-01 MED ORDER — BUPROPION HCL ER (XL) 150 MG PO TB24
150.0000 mg | ORAL_TABLET | Freq: Every day | ORAL | 1 refills | Status: AC
  Filled 2024-05-01: qty 90, 90d supply, fill #0

## 2024-05-01 NOTE — Progress Notes (Signed)
 "     [facility]  Patient ID: Damon Avila,  MRN: [id number]  PCP: Knute Thersia Bitters, FNP  Date of Evaluation: 05/01/2024 Time spent:30 minutes  Chief Complaint:  Chief Complaint   Anxiety; Depression; Follow-up; Medication Refill; Patient Education; Medication Problem     HISTORY/CURRENT STATUS: HPI Damon Avila, 24 year old male presents to this office for follow up and medication management. Collateral information should be considered reliable.  Patient is calm and pleasant today.  He worked third shift last night and normally goes to bed but has a meeting today.  Says that he has been very busy with work and ran out of his medication a couple weeks ago.  Said that he tried to restart but it made him nauseous and feel sick.  He is needing refills today and would like to restart his normal medication regimen.  No medication changes this visit.  Reports anxiety at 4/10 and depression at 4/10.  Sleeping 7-8 hours per night. Reports eating well.   He denies any current mania today, no psychosis, no history of auditory or visual hallucinations.  No SI or HI.  He does have 1 prior hospitalization in 2015 for major depressive disorder.  Past psychiatric medication trials: Zoloft  Adderall Ritalin Vyvanse   Individual Medical History/ Review of Systems: Changes? :No   Allergies: Patient has no known allergies.  Current Medications: Current Medications[1] Medication Side Effects: none  Family Medical/ Social History: Changes? No  MENTAL HEALTH EXAM:  There were no vitals taken for this visit.There is no height or weight on file to calculate BMI.  General Appearance: Casual, Neat, and Well Groomed  Eye Contact:  Good  Speech:  Clear and Coherent  Volume:  Normal  Mood:  NA  Affect:  Appropriate  Thought Process:  Coherent  Orientation:  Full (Time, Place, and Person)  Thought Content: Logical   Suicidal Thoughts:  No  Homicidal Thoughts:  No  Memory:  WNL  Judgement:   Good  Insight:  Good  Psychomotor Activity:  Normal  Concentration:  Concentration: Good  Recall:  Good  Fund of Knowledge: Good  Language: Good  Assets:  Desire for Improvement  ADL's:  Intact  Cognition: WNL  Prognosis:  Good    DIAGNOSES:    ICD-10-CM   1. Major depressive disorder, recurrent episode, moderate (HCC)  F33.1 lamoTRIgine  (LAMICTAL ) 150 MG tablet    buPROPion  (WELLBUTRIN  XL) 150 MG 24 hr tablet    Vilazodone  HCl (VIIBRYD ) 40 MG TABS    2. Unspecified mood (affective) disorder  F39 lamoTRIgine  (LAMICTAL ) 150 MG tablet    buPROPion  (WELLBUTRIN  XL) 150 MG 24 hr tablet    Vilazodone  HCl (VIIBRYD ) 40 MG TABS    3. Generalized anxiety disorder  F41.1 buPROPion  (WELLBUTRIN  XL) 150 MG 24 hr tablet    Vilazodone  HCl (VIIBRYD ) 40 MG TABS      Receiving Psychotherapy: No    RECOMMENDATIONS:   Greater than 50% of 30 min face to face time with patient was spent on counseling and coordination of care. No new social changes.  We reviewed his medications and discussed him recently stopping his medication abruptly due to running out of the med.  Patient did not feel well when trying to re-introduce original dosage.  He will temporarily reduce medication in half for 2 weeks before resuming full-strength.        We agreed today to: To take 1/2 tablet of his medications for 2 weeks and then resume one whole tablet  daily. This will apply to Viibryd , Wellbutrin , and Lamictal . Continue Viibryd  to 40 mg daily Continue Wellbutrin  150 mg XL daily Continue Lamictal  to 150 mg daily Continue Ativan ,  0.5 mg daily as needed only for severe anixety Patient would like to hold Vyvanse  20 mg daily in the am after breakfast until he is more stable with meds. Will follow-up in 12 weeks to reassess Provided emergency contact information To report side effects or worsening symptoms promptly Monitor for any sign of rash. Please taking Lamictal  and contact office immediately rash develops.  Recommend seeking urgent medical attention if rash is severe and/or spreading quickly.  Reviewed PDMP   Redell DELENA Pizza, NP      [1]  Current Outpatient Medications:    buPROPion  (WELLBUTRIN  XL) 150 MG 24 hr tablet, Take 1 tablet (150 mg total) by mouth daily., Disp: 90 tablet, Rfl: 1   cabotegravir  ER (APRETUDE ) 600 MG/3ML injection, Inject 3 mLs (600 mg total) into the muscle every 2 (two) months., Disp: 3 mL, Rfl: 5   cetirizine (ZYRTEC) 10 MG tablet, Take 10 mg by mouth 2 (two) times daily., Disp: , Rfl:    lamoTRIgine  (LAMICTAL ) 150 MG tablet, Take 1 tablet (150 mg total) by mouth daily., Disp: 90 tablet, Rfl: 1   lisdexamfetamine  (VYVANSE ) 20 MG capsule, Take 1 capsule (20 mg total) by mouth daily., Disp: 30 capsule, Rfl: 0   LORazepam  (ATIVAN ) 0.5 MG tablet, Take 1 tablet (0.5 mg total) by mouth every 8 (eight) hours., Disp: 20 tablet, Rfl: 1   Vilazodone  HCl (VIIBRYD ) 40 MG TABS, Take 1 tablet (40 mg total) by mouth daily., Disp: 90 tablet, Rfl: 1  "

## 2024-06-06 ENCOUNTER — Encounter (HOSPITAL_BASED_OUTPATIENT_CLINIC_OR_DEPARTMENT_OTHER): Admitting: Family Medicine

## 2024-08-01 ENCOUNTER — Ambulatory Visit: Admitting: Behavioral Health
